# Patient Record
Sex: Female | Born: 1965 | Race: White | Hispanic: No | Marital: Single | Smoking: Former smoker
Health system: Southern US, Community
[De-identification: ages and names within clinical notes are randomized; demographics above are authoritative.]

## PROBLEM LIST (undated history)

## (undated) DIAGNOSIS — J449 Chronic obstructive pulmonary disease, unspecified: Secondary | ICD-10-CM

## (undated) DIAGNOSIS — E079 Disorder of thyroid, unspecified: Secondary | ICD-10-CM

## (undated) DIAGNOSIS — L0291 Cutaneous abscess, unspecified: Secondary | ICD-10-CM

## (undated) DIAGNOSIS — J40 Bronchitis, not specified as acute or chronic: Secondary | ICD-10-CM

## (undated) DIAGNOSIS — J45909 Unspecified asthma, uncomplicated: Secondary | ICD-10-CM

## (undated) HISTORY — PX: MOUTH SURGERY: SHX715

---

## 2012-01-23 ENCOUNTER — Emergency Department (HOSPITAL_COMMUNITY)
Admission: EM | Admit: 2012-01-23 | Discharge: 2012-01-23 | Disposition: A | Discharge status: Home / Self Care | Payer: Self-pay | Attending: Emergency Medicine | Admitting: Emergency Medicine

## 2012-01-23 ENCOUNTER — Encounter (HOSPITAL_COMMUNITY): Payer: Self-pay

## 2012-01-23 DIAGNOSIS — J4489 Other specified chronic obstructive pulmonary disease: Secondary | ICD-10-CM | POA: Insufficient documentation

## 2012-01-23 DIAGNOSIS — F17200 Nicotine dependence, unspecified, uncomplicated: Secondary | ICD-10-CM | POA: Insufficient documentation

## 2012-01-23 DIAGNOSIS — J449 Chronic obstructive pulmonary disease, unspecified: Secondary | ICD-10-CM | POA: Insufficient documentation

## 2012-01-23 DIAGNOSIS — E079 Disorder of thyroid, unspecified: Secondary | ICD-10-CM | POA: Insufficient documentation

## 2012-01-23 DIAGNOSIS — L0291 Cutaneous abscess, unspecified: Secondary | ICD-10-CM

## 2012-01-23 DIAGNOSIS — L988 Other specified disorders of the skin and subcutaneous tissue: Secondary | ICD-10-CM | POA: Insufficient documentation

## 2012-01-23 HISTORY — DX: Disorder of thyroid, unspecified: E07.9

## 2012-01-23 HISTORY — DX: Chronic obstructive pulmonary disease, unspecified: J44.9

## 2012-01-23 HISTORY — DX: Bronchitis, not specified as acute or chronic: J40

## 2012-01-23 HISTORY — DX: Cutaneous abscess, unspecified: L02.91

## 2012-01-23 MED ORDER — HYDROCODONE-ACETAMINOPHEN 5-325 MG PO TABS
1.0000 | ORAL_TABLET | Freq: Once | ORAL | Status: AC
  Administered 2012-01-23: 1 via ORAL
  Filled 2012-01-23: qty 1

## 2012-01-23 MED ORDER — SULFAMETHOXAZOLE-TMP DS 800-160 MG PO TABS
1.0000 | ORAL_TABLET | Freq: Once | ORAL | Status: AC
  Administered 2012-01-23: 1 via ORAL
  Filled 2012-01-23: qty 1

## 2012-01-23 MED ORDER — HYDROCODONE-ACETAMINOPHEN 5-325 MG PO TABS: ORAL_TABLET | ORAL | Status: AC

## 2012-01-23 MED ORDER — SULFAMETHOXAZOLE-TRIMETHOPRIM 800-160 MG PO TABS: 1.0000 | ORAL_TABLET | Freq: Two times a day (BID) | ORAL | Status: AC

## 2012-01-23 NOTE — ED Provider Notes (Signed)
History     CSN: [id number]  Arrival date & time 01/23/12  1924   First MD Initiated Contact with Patient 01/23/12 1943      Chief Complaint  Patient presents with  . Abscess    (Consider location/radiation/quality/duration/timing/severity/associated sxs/prior treatment) HPI Comments: Patient c/o red, painful lesion to the left forearm that she noticed several hours PTA.  She is unsure if it's a possible spider bite or abscess.  She denies fever, numbness of the arm or pain proximal or distal to the affected area.  Patient is a 46 y.o. female presenting with abscess. The history is provided by the patient.  Abscess  This is a new problem. The current episode started just prior to arrival. The onset was sudden. The problem occurs continuously. The problem has been unchanged. The abscess is present on the left arm. The problem is mild. The abscess is characterized by painfulness and redness. The patient was exposed to spider bites. The abscess first occurred at home. Pertinent negatives include no fever, no vomiting and no sore throat. Her past medical history is significant for skin abscesses in family. There were no sick contacts. She has received no recent medical care.    Past Medical History  Diagnosis Date  . Thyroid disease   . Bronchitis   . COPD (chronic obstructive pulmonary disease)   . Abscess     History reviewed. No pertinent past surgical history.  History reviewed. No pertinent family history.  History  Substance Use Topics  . Smoking status: Current Everyday Smoker -- 0.5 packs/day    Types: Cigarettes  . Smokeless tobacco: Not on file  . Alcohol Use: No    OB History    Grav Para Term Preterm Abortions TAB SAB Ect Mult Living                  Review of Systems  Constitutional: Negative for fever.  HENT: Negative for sore throat.   Gastrointestinal: Negative for vomiting.  Skin: Positive for color change and wound.  Neurological: Negative for  weakness and numbness.  Hematological: Does not bruise/bleed easily.  All other systems reviewed and are negative.    Allergies  Review of patient's allergies indicates no known allergies.  Home Medications   Current Outpatient Rx  Name Route Sig Dispense Refill  . LEVOTHYROXINE SODIUM 88 MCG PO TABS Oral Take 88 mcg by mouth daily.      BP 119/91  Pulse 89  Temp(Src) 98.1 F (36.7 C) (Oral)  Resp 20  Ht 5' (1.524 m)  Wt 187 lb (84.823 kg)  BMI 36.52 kg/m2  SpO2 98%  Physical Exam  Nursing note and vitals reviewed. Constitutional: She is oriented to person, place, and time. She appears well-developed and well-nourished. No distress.  HENT:  Head: Normocephalic and atraumatic.  Cardiovascular: Normal rate, regular rhythm, normal heart sounds and intact distal pulses.   Pulmonary/Chest: Effort normal and breath sounds normal.  Musculoskeletal: She exhibits tenderness. She exhibits no edema.       Left elbow: She exhibits normal range of motion, no swelling and no effusion. no tenderness found.       Arms: Neurological: She is alert and oriented to person, place, and time. She exhibits normal muscle tone. Coordination normal.  Skin:       See MS exam    ED Course  Procedures (including critical care time)  Labs Reviewed - No data to display      MDM    Previous  medical charts, nursing notes and vitals signs from this visit were reviewed by me   All laboratory results and/or imaging results performed on this visit, if applicable, were reviewed by me and discussed with the patient and/or parent as well as recommendation for follow-up    MEDICATIONS GIVEN IN ED:  Bactrim, norco   Two centimeter indurated papule to the lateral left forearm. Mild surrounding erythema. No drainage or fluctuance. Patient has been squeezing on the area. Does seem to be some bruising present. I have marked leading edge of the erythema. Patient agrees to water soaks and to return to  the ER in 1-2 days if the symptoms are worsening for possible incision and drainage.    PRESCRIPTIONS GIVEN AT DISCHARGE:  Bactrim , norco #20   Pt stable in ED with no significant deterioration in condition. Pt feels improved after observation and/or treatment in ED. Patient / Family / Caregiver understand and agree with initial ED impression and plan with expectations set for ED visit.  Patient agrees to return to ED for any worsening symptoms        Jase Reep L. [place], [place] 01/27/12 [id number]

## 2012-01-23 NOTE — Discharge Instructions (Signed)
Abscess An abscess (boil or furuncle) is an infected area under your skin. This area is filled with yellowish white fluid (pus). HOME CARE   Only take medicine as told by your doctor.   Keep the skin clean around your abscess. Keep clothes that may touch the abscess clean.   Change any bandages (dressings) as told by your doctor.   Avoid direct skin contact with other people. The infection can spread by skin contact with others.   Practice good hygiene and do not share personal care items.   Do not share athletic equipment, towels, or whirlpools. Shower after every practice or work out session.   If a draining area cannot be covered:   Do not play sports.   Children should not go to daycare until the wound has healed or until fluid (drainage) stops coming out of the wound.   See your doctor for a follow-up visit as told.  GET HELP RIGHT AWAY IF:   There is more pain, puffiness (swelling), and redness in the wound site.   There is fluid or bleeding from the wound site.   You have muscle aches, chills, fever, or feel sick.   You or your child has a temperature by mouth above 102 F (38.9 C), not controlled by medicine.   Your baby is older than 3 months with a rectal temperature of 102 F (38.9 C) or higher.  MAKE SURE YOU:   Understand these instructions.   Will watch your condition.   Will get help right away if you are not doing well or get worse.  Document Released: 01/27/2008 Document Revised: 07/30/2011 Document Reviewed: 01/27/2008 Exit[employer] Patient Information 2012 [employer]. 

## 2012-01-23 NOTE — ED Notes (Signed)
Place came up on left elbow early this evening, possible spider bite or may have a tick under my skin per pt. Have had an abscess before per pt.

## 2012-01-23 NOTE — ED Notes (Signed)
Pt presents with moderate size abscess on elbow with firm edema surrounding abscess site.  Abscess has a prominent dark center. Site is warm to touch. Pt denies fever.  NAD noted at this time.

## 2012-01-29 NOTE — ED Provider Notes (Signed)
Medical screening examination/treatment/procedure(s) were performed by non-physician practitioner and as supervising physician I was immediately available for consultation/collaboration.   Brennyn Haisley W. Oma Marzan, MD 01/29/12 2315 

## 2012-05-06 DIAGNOSIS — R079 Chest pain, unspecified: Secondary | ICD-10-CM

## 2017-04-20 ENCOUNTER — Encounter: Payer: Self-pay | Admitting: Internal Medicine

## 2017-04-20 ENCOUNTER — Ambulatory Visit (INDEPENDENT_AMBULATORY_CARE_PROVIDER_SITE_OTHER): Payer: Medicaid Other | Admitting: Internal Medicine

## 2017-04-20 ENCOUNTER — Ambulatory Visit (INDEPENDENT_AMBULATORY_CARE_PROVIDER_SITE_OTHER)
Admission: RE | Admit: 2017-04-20 | Discharge: 2017-04-20 | Disposition: A | Discharge status: Home / Self Care | Payer: Medicaid Other | Source: Ambulatory Visit | Attending: Internal Medicine | Admitting: Internal Medicine

## 2017-04-20 VITALS — BP 112/78 | HR 87 | Ht 60.0 in | Wt 207.4 lb

## 2017-04-20 DIAGNOSIS — J449 Chronic obstructive pulmonary disease, unspecified: Secondary | ICD-10-CM | POA: Diagnosis not present

## 2017-04-20 DIAGNOSIS — F17210 Nicotine dependence, cigarettes, uncomplicated: Secondary | ICD-10-CM | POA: Diagnosis not present

## 2017-04-20 MED ORDER — GLYCOPYRROLATE-FORMOTEROL 9-4.8 MCG/ACT IN AERO: 2.0000 | INHALATION_SPRAY | Freq: Two times a day (BID) | RESPIRATORY_TRACT | 11 refills | Status: DC

## 2017-04-20 MED ORDER — GLYCOPYRROLATE-FORMOTEROL 9-4.8 MCG/ACT IN AERO: 2.0000 | INHALATION_SPRAY | Freq: Two times a day (BID) | RESPIRATORY_TRACT | 0 refills | Status: DC

## 2017-04-20 MED ORDER — PREDNISONE 10 MG PO TABS: ORAL_TABLET | ORAL | 0 refills | Status: DC

## 2017-04-20 NOTE — Assessment & Plan Note (Addendum)
Spirometry 04/20/2017  FEV1 0.94 (39%)  Ratio 56 p am symb/duoneb w/in 2 h - 04/20/2017   Walked RA x one lap @ 185 stopped due to  Sob/ fast pace, no desat - 04/20/2017  After extensive coaching HFA effectiveness =    50%  > try bevespi 2bid   She has severe airflow obst and also MO causing significant limitation s tendency to exac so best characterized for now as Group B >  Lama/laba appropriate but just in case there is asthmatic component (seems unlikely based in pattern and the fact she hasn't flared despite very poor hfa to date) > rec  Pred x 12 day taper and if better on pred and worse off then change to trelegy if insurance will allow it    Total time devoted to counseling  > 50 % of initial 60 min office visit:  review case with pt/ discussion of options/alternatives/ personally creating written customized instructions  in presence of pt  then going over those specific  Instructions directly with the pt including how to use all of the meds but in particular covering each new medication in detail and the difference between the maintenance= "automatic" meds and the prns using an action plan format for the latter (If this problem/symptom => do that organization reading Left to right).  Please see AVS from this visit for a full list of these instructions which I personally wrote for this pt and  are unique to this visit.

## 2017-04-20 NOTE — Assessment & Plan Note (Signed)
>   3 min Discussed the risks and costs (both direct and indirect)  of smoking relative to the benefits of quitting but patient unwilling to commit at this point to a specific quit date.    Although I don't endorse regular use of e cigs/ many pts find them helpful; however, I emphasized they should be considered a "one-way bridge" off all tobacco products.  

## 2017-04-20 NOTE — Assessment & Plan Note (Signed)
Body mass index is 40.51 kg/m.  -  trending up No results found for: TSH   Contributing to gerd risk/ doe/reviewed the need and the process to achieve and maintain neg calorie balance > defer f/u primary care including intermittently monitoring thyroid status

## 2017-04-20 NOTE — Progress Notes (Signed)
Spoke with pt and notified of results per Dr. Wert. Pt verbalized understanding and denied any questions. 

## 2017-04-20 NOTE — Patient Instructions (Addendum)
Plan A = Automatic = Bevespi Take 2 puffs first thing in am and then another 2 puffs about 12 hours later.     Work on inhaler technique:  relax and gently blow all the way out then take a nice smooth deep breath back in, triggering the inhaler at same time you start breathing in.  Hold for up to 5 seconds if you can. Blow out thru nose. Rinse and gargle with water when done      Plan B = Backup Only use your albuterol (proair) as a rescue medication to be used if you can't catch your breath by resting or doing a relaxed purse lip breathing pattern.  - The less you use it, the better it will work when you need it. - Ok to use the inhaler up to 2 puffs  every 4 hours if you must but call for appointment if use goes up over your usual need - Don't leave home without it !!  (think of it like the spare tire for your car)   Plan C = Crisis - only use your albuterol nebulizer if you first try Plan B and it fails to help > ok to use the nebulizer up to every 4 hours but if start needing it regularly call for immediate appointment   Take Prednisone 10 mg x 4 for three days 3 for three days 2 for three days 1 for three days and stop    Please schedule a follow up office visit in 6 weeks, call sooner if needed with pfts on return

## 2017-04-20 NOTE — Progress Notes (Signed)
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 Pulmonary Consult    Referred by Dr. Colvin Caroli. Pt states that she was dxed with COPD in 2009. She states that she gets winded walking short distances such as from room to room at home. She also c/o cough- worse at night and first thing in the am- clear sputum.    uses neb first thing in am then symbicort w/in half an hour which was about 2 h prior to OV   Last pred was nov 2017  Wakes up congested/ wheezy but no purulent sputum Doe= MMRC3 = can't walk 100 yards even at a slow pace at a flat grade s stopping due to sob  Onset was indolent and pattern is progressive and parallels wt gain also Worse breathing when exp to strong odors/ heat and humidity   No obvious other patterns in day to day or daytime variability or assoc excess/ purulent sputum or mucus plugs or hemoptysis or cp or chest tightness, subjective wheeze or overt sinus or hb symptoms. No unusual exp hx or h/o childhood pna/ asthma or knowledge of premature birth.  Sleeping ok without nocturnal  or early am exacerbation  of respiratory  c/o's or need for noct saba. Also denies any obvious fluctuation of symptoms with weather or environmental changes or other aggravating or alleviating factors except as outlined above   Current Medications, Allergies, Complete Past Medical History, Past Surgical History, Family History, and Social History were reviewed in [employer] record.  ROS   The following are not active complaints unless bolded sore throat, dysphagia, dental problems, itching, sneezing,  nasal congestion or excess/ purulent secretions, ear ache,   fever, chills, sweats, unintended wt loss, classically pleuritic or exertional cp,  orthopnea pnd or leg swelling, presyncope, palpitations, abdominal pain, anorexia, nausea, vomiting, diarrhea  or change in bowel or bladder habits, change in stools or urine, dysuria,hematuria,  rash, arthralgias, visual complaints, headache, numbness, weakness or ataxia or problems with walking or coordination,  change in mood/affect or memory.            Review of Systems     Objective:   Physical Exam    amb obese hoarse wf nad  Wt Readings from Last 3 Encounters:  04/20/17 207 lb 6.4 oz (94.1 kg)  01/23/12 187 lb (84.8 kg)    Vital signs reviewed - Note on arrival 02 sats  96% on RA      HEENT: nl dentition, turbinates bilaterally, and oropharynx. Nl external ear canals without cough reflex   NECK :  without JVD/Nodes/TM/ nl carotid upstrokes bilaterally   LUNGS: no acc muscle use,  slt barrel chest with pan exp distant bilateral  wheezes  CV:  RRR  no s3 or murmur or increase in P2, and no edema   ABD:  Quite obese soft and nontender with  Pos early hoover's in the supine position. No bruits or  organomegaly appreciated, bowel sounds nl  MS:  Nl gait/ ext warm without deformities, calf tenderness, cyanosis or clubbing No obvious joint restrictions   SKIN: warm and dry without lesions    NEURO:  alert, approp, nl sensorium with  no motor or cerebellar deficits apparent.    CXR PA and Lateral:   04/20/2017 :    I personally reviewed images and  impression as follows:  Moderate copd/cb changes      Assessment:

## 2017-06-02 ENCOUNTER — Other Ambulatory Visit (INDEPENDENT_AMBULATORY_CARE_PROVIDER_SITE_OTHER): Payer: Medicaid Other

## 2017-06-02 ENCOUNTER — Ambulatory Visit (INDEPENDENT_AMBULATORY_CARE_PROVIDER_SITE_OTHER): Payer: Medicaid Other | Admitting: Internal Medicine

## 2017-06-02 ENCOUNTER — Encounter: Payer: Self-pay | Admitting: Internal Medicine

## 2017-06-02 VITALS — BP 130/72 | HR 86 | Ht 60.0 in | Wt 210.0 lb

## 2017-06-02 DIAGNOSIS — F17210 Nicotine dependence, cigarettes, uncomplicated: Secondary | ICD-10-CM

## 2017-06-02 DIAGNOSIS — J449 Chronic obstructive pulmonary disease, unspecified: Secondary | ICD-10-CM

## 2017-06-02 LAB — PULMONARY FUNCTION TEST
DL/VA % PRED: 86 %
DL/VA: 3.68 ml/min/mmHg/L
DLCO UNC % PRED: 66 %
DLCO cor % pred: 66 %
DLCO cor: 12.49 ml/min/mmHg
DLCO unc: 12.64 ml/min/mmHg
FEF 25-75 PRE: 0.34 L/s
FEF 25-75 Post: 0.3 L/sec
FEF2575-%CHANGE-POST: -12 %
FEF2575-%Pred-Post: 12 %
FEF2575-%Pred-Pre: 13 %
FEV1-%Change-Post: 0 %
FEV1-%Pred-Post: 33 %
FEV1-%Pred-Pre: 33 %
FEV1-POST: 0.79 L
FEV1-PRE: 0.79 L
FEV1FVC-%CHANGE-POST: 9 %
FEV1FVC-%Pred-Pre: 51 %
FEV6-%CHANGE-POST: -8 %
FEV6-%PRED-POST: 56 %
FEV6-%PRED-PRE: 61 %
FEV6-PRE: 1.81 L
FEV6-Post: 1.66 L
FEV6FVC-%CHANGE-POST: 0 %
FEV6FVC-%PRED-PRE: 98 %
FEV6FVC-%Pred-Post: 98 %
FVC-%Change-Post: -8 %
FVC-%Pred-Post: 57 %
FVC-%Pred-Pre: 63 %
FVC-Post: 1.74 L
FVC-Pre: 1.9 L
POST FEV6/FVC RATIO: 95 %
PRE FEV1/FVC RATIO: 41 %
Post FEV1/FVC ratio: 45 %
Pre FEV6/FVC Ratio: 95 %
RV % PRED: 210 %
RV: 3.42 L
TLC % PRED: 122 %
TLC: 5.45 L

## 2017-06-02 LAB — BASIC METABOLIC PANEL
BUN: 10 mg/dL (ref 6–23)
CHLORIDE: 103 meq/L (ref 96–112)
CO2: 28 meq/L (ref 19–32)
CREATININE: 0.74 mg/dL (ref 0.40–1.20)
Calcium: 9.3 mg/dL (ref 8.4–10.5)
GFR: 87.53 mL/min (ref >60.00)
Glucose, Bld: 92 mg/dL (ref 70–99)
POTASSIUM: 4 meq/L (ref 3.5–5.1)
SODIUM: 139 meq/L (ref 135–145)

## 2017-06-02 LAB — CBC WITH DIFFERENTIAL/PLATELET
BASOS PCT: 0.8 % (ref 0.0–3.0)
Basophils Absolute: 0.1 10*3/uL (ref 0.0–0.1)
EOS PCT: 2 % (ref 0.0–5.0)
Eosinophils Absolute: 0.1 10*3/uL (ref 0.0–0.7)
HEMATOCRIT: 45.2 % (ref 36.0–46.0)
HEMOGLOBIN: 14.8 g/dL (ref 12.0–15.0)
LYMPHS PCT: 25.8 % (ref 12.0–46.0)
Lymphs Abs: 1.9 10*3/uL (ref 0.7–4.0)
MCHC: 32.8 g/dL (ref 30.0–36.0)
MCV: 92.9 fl (ref 78.0–100.0)
Monocytes Absolute: 0.4 10*3/uL (ref 0.1–1.0)
Monocytes Relative: 5.9 % (ref 3.0–12.0)
NEUTROS ABS: 4.9 10*3/uL (ref 1.4–7.7)
Neutrophils Relative %: 65.5 % (ref 43.0–77.0)
PLATELETS: 233 10*3/uL (ref 150.0–400.0)
RBC: 4.87 Mil/uL (ref 3.87–5.11)
RDW: 13.6 % (ref 11.5–15.5)
WBC: 7.5 10*3/uL (ref 4.0–10.5)

## 2017-06-02 MED ORDER — FAMOTIDINE 20 MG PO TABS: ORAL_TABLET | ORAL | 11 refills | Status: DC

## 2017-06-02 MED ORDER — FLUTTER DEVI: 1.0000 | 0 refills | Status: AC | PRN

## 2017-06-02 MED ORDER — PANTOPRAZOLE SODIUM 40 MG PO TBEC: 40.0000 mg | DELAYED_RELEASE_TABLET | Freq: Every day | ORAL | 11 refills | Status: DC

## 2017-06-02 MED ORDER — TIOTROPIUM BROMIDE MONOHYDRATE 2.5 MCG/ACT IN AERS: 2.0000 | INHALATION_SPRAY | Freq: Every day | RESPIRATORY_TRACT | 11 refills | Status: DC

## 2017-06-02 MED ORDER — TIOTROPIUM BROMIDE MONOHYDRATE 2.5 MCG/ACT IN AERS: 2.0000 | INHALATION_SPRAY | Freq: Every day | RESPIRATORY_TRACT | 0 refills | Status: DC

## 2017-06-02 MED ORDER — BUDESONIDE-FORMOTEROL FUMARATE 160-4.5 MCG/ACT IN AERO: INHALATION_SPRAY | RESPIRATORY_TRACT | 12 refills | Status: DC

## 2017-06-02 NOTE — Progress Notes (Signed)
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 Pulmonary Consult    Referred by Dr. Colvin Caroli. Pt states that she was dxed with COPD in 2009. She states that she gets winded walking short distances such as from room to room at home. She also c/o cough- worse at night and first thing in the am- clear sputum.    uses neb first thing in am then symbicort w/in half an hour which was about 2 h prior to OV   Last pred was nov 2017 ? [facility]  Wakes up congested/ wheezy but no purulent sputum Doe= MMRC3 = can't walk 100 yards even at a slow pace at a flat grade s stopping due to sob  Onset was indolent and pattern is progressive and parallels wt gain also Worse breathing when exp to strong odors/ heat and humidity  rec Plan A = Automatic = Bevespi Take 2 puffs first thing in am and then another 2 puffs about 12 hours later.  Work on inhaler technique:  Plan B = Backup Only use your albuterol (proair) Plan C = Crisis - only use your albuterol nebulizer if you first try Plan B and it fails to help > ok to use the nebulizer up to every 4 hours but if start needing it regularly call for immediate appointment Take Prednisone 10 mg x 4 for three days 3 for three days 2 for three days 1 for three days and stop    06/02/2017  f/u ov/Wert re:  GOLD III/ still smoking  Chief Complaint  Patient presents with  . Follow-up    SOB, chest tightness after taking prednison   doe =  MMRC3 = can't walk 100 yards even at a slow pace at a flat grade s stopping due to sob Sleeping on L side down / no 02    Minimal am mucus but worse coughing fits daytime since last ov and no better p pred or changing back to symb 160    No obvious day to day or daytime variability or assoc excess/ purulent sputum or mucus plugs or hemoptysis or  Ongoing  cp or chest tightness(tansient on pred) , subjective wheeze or overt sinus or hb symptoms. No unusual exp hx or h/o childhood pna/ asthma or knowledge of premature birth.  Sleeping ok flat without nocturnal  or early am exacerbation  of respiratory  c/o's or need for noct saba. Also denies any obvious fluctuation of symptoms with weather or environmental changes or other aggravating or alleviating factors except as outlined above   Current Allergies, Complete Past Medical History, Past Surgical History, Family History, and Social History were reviewed in [employer] record.  ROS  The following are not active complaints unless bolded Hoarseness, sore throat, dysphagia, dental problems, itching, sneezing,  nasal congestion or discharge of excess mucus or purulent secretions, ear ache,   fever, chills,  sweats, unintended wt loss or wt gain, classically pleuritic or exertional cp,  orthopnea pnd or leg swelling, presyncope, palpitations, abdominal pain, anorexia, nausea, vomiting, diarrhea  or change in bowel habits or change in bladder habits, change in stools or change in urine, dysuria, hematuria,  rash, arthralgias, visual complaints, headache, numbness, weakness or ataxia or problems with walking or coordination,  change in mood/affect or memory.        Current Meds  Medication Sig   symbicort  160 2bid  Inhale 2 puffs into the lungs 2 (two) times daily.  Marland Kitchen ipratropium-albuterol (DUONEB) 0.5-2.5 (3) MG/3ML SOLN 1 vial in neb every 6 hours as needed  . levothyroxine (SYNTHROID, LEVOTHROID) 75 MCG tablet Take 75 mcg by mouth  daily.  Marland Kitchen PROAIR HFA 108 (90 Base) MCG/ACT inhaler Inhale 2 puffs into the lungs every 4 (four) hours as needed.  . simvastatin (ZOCOR) 20 MG tablet Take 20 mg by mouth at bedtime.  . [DISCONTINUED] predniSONE (DELTASONE) 10 MG tablet Take 4 for three days 3 for three days 2 for three days 1 for three days and stop                 Objective:   Physical Exam    amb obese wf nad    06/02/2017    210   04/20/17 207 lb 6.4 oz (94.1 kg)  01/23/12 187 lb (84.8 kg)    Vital signs reviewed - Note on arrival 02 sats  95% on RA      HEENT: nl dentition, turbinates bilaterally, and oropharynx. Nl external ear canals without cough reflex   NECK :  without JVD/Nodes/TM/ nl carotid upstrokes bilaterally   LUNGS: no acc muscle use,  slt barrel chest with severe coughing fits/ distant bs with mostly pseudowheeze better with plm   CV:  RRR  no s3 or murmur or increase in P2, and no edema   ABD:  Quite obese soft and nontender with  Pos early hoover's in the supine position. No bruits or organomegaly appreciated, bowel sounds nl  MS:  Nl gait/ ext warm without deformities, calf tenderness, cyanosis or clubbing No obvious joint restrictions   SKIN: warm and dry without lesions    NEURO:  alert, approp, nl sensorium with  no motor or cerebellar deficits apparent.     I personally reviewed images and agree with radiology impression as follows:  CXR:    04/20/17 COPD.  No active disease     Assessment:

## 2017-06-02 NOTE — Progress Notes (Signed)
PFT done today. 

## 2017-06-02 NOTE — Assessment & Plan Note (Signed)
Spirometry 04/20/2017  FEV1 0.94 (39%)  Ratio 56 p am symb/duoneb w/in 2 h - 04/20/2017   Walked RA x one lap @ 185 stopped due to  Sob/ fast pace, no desat - 04/20/2017  After extensive coaching HFA effectiveness =    50%  > try bevespi 2bid  No better   - PFT's  06/02/2017  FEV1 0.79  (33 % ) ratio 45  p no % improvement from saba p symbicort x 2 pffs  prior to study with DLCO  66/66c % corrects to 86  % for alv volume   - added flutter valve 06/02/2017 - 06/02/2017  After extensive coaching HFA effectiveness =    75% > try spiriva smi 2 qam and gerd rx    DDX of  difficult airways management almost all start with A and  include Adherence, Ace Inhibitors, Acid Reflux, Active Sinus Disease, Alpha 1 Antitripsin deficiency, Anxiety masquerading as Airways dz,  ABPA,  Allergy(esp in young), Aspiration (esp in elderly), Adverse effects of meds,  Active smokers, A bunch of PE's (a small clot burden can't cause this syndrome unless there is already severe underlying pulm or vascular dz with poor reserve) plus two Bs  = Bronchiectasis and Beta blocker use..and one C= CHF  Adherence is always the initial "prime suspect" and is a multilayered concern that requires a "trust but verify" approach in every patient - starting with knowing how to use medications, especially inhalers, correctly, keeping up with refills and understanding the fundamental difference between maintenance and prns vs those medications only taken for a very short course and then stopped and not refilled.  - see hfa teaching - return with all meds in hand using a trust but verify approach to confirm accurate Medication  Reconciliation The principal here is that until we are certain that the  patients are doing what we've asked, it makes no sense to ask them to do more.   Active smoking (see separate a/p)   ? Acid (or non-acid) GERD > always difficult to exclude as up to 75% of pts in some series report no assoc GI/ Heartburn symptoms and she  reports worsening symptoms with prednisone which is more c/w gerd than ab> rec max (24h)  acid suppression and diet restrictions/ reviewed and instructions given in writing.  ? Alpha one AT def > check phenotype/ levels    I had an extended discussion with the patient reviewing all relevant studies completed to date and  lasting 15 to 20 minutes of a 25 minute visit    Each maintenance medication was reviewed in detail including most importantly the difference between maintenance and prns and under what circumstances the prns are to be triggered using an action plan format that is not reflected in the computer generated alphabetically organized AVS.    Please see AVS for specific instructions unique to this visit that I personally wrote and verbalized to the the pt in detail and then reviewed with pt  by my nurse highlighting any  changes in therapy recommended at today's visit to their plan of care.

## 2017-06-02 NOTE — Assessment & Plan Note (Signed)
Body mass index is 41.01 kg/m.  -  trending up No results found for: TSH   Contributing to gerd risk/ doe/reviewed the need and the process to achieve and maintain neg calorie balance > defer f/u primary care including intermittently monitoring thyroid status

## 2017-06-02 NOTE — Assessment & Plan Note (Signed)
>   3 min   Obstacles are lives with smoker  Life or breath reviewed > Follow up per Primary Care planned

## 2017-06-02 NOTE — Patient Instructions (Addendum)
Plan A = Automatic = symbicort 160 x 2pffs then spiriva x 2 pffs first thing in am and repeat symb x 2 puffs in  am  Pantoprazole (protonix) 40 mg   Take  30-60 min before first meal of the day and Pepcid (famotidine)  20 mg one @  bedtime until return to office - this is the best way to tell whether stomach acid is contributing to your problem.    GERD (REFLUX)  is an extremely common cause of respiratory symptoms just like yours , many times with no obvious heartburn at all.    It can be treated with medication, but also with lifestyle changes including elevation of the head of your bed (ideally with 6 inch  bed blocks),  Smoking cessation, avoidance of late meals, excessive alcohol, and avoid fatty foods, chocolate, peppermint, colas, red wine, and acidic juices such as orange juice.  NO MINT OR MENTHOL PRODUCTS SO NO COUGH DROPS   USE SUGARLESS CANDY INSTEAD (Jolley ranchers or Stover's or Life Savers) or even ice chips will also do - the key is to swallow to prevent all throat clearing. NO OIL BASED VITAMINS - use powdered substitutes.     Plan B = Backup Only use your albuterol as a rescue medication to be used if you can't catch your breath by resting or doing a relaxed purse lip breathing pattern.  - The less you use it, the better it will work when you need it. - Ok to use the inhaler up to 2 puffs  every 4 hours if you must but call for appointment if use goes up over your usual need - Don't leave home without it !!  (think of it like the spare tire for your car)   Plan C = Crisis - only use your albuterol nebulizer if you first try Plan B and it fails to help > ok to use the nebulizer up to every 4 hours but if start needing it regularly call for immediate appointment   The key is to stop smoking completely before smoking completely stops you - it's a matter of life and breath   For cough > mucinex dm up to 1200 mg every 12 hours and cough into the flutter valve    Please  remember to go to the lab department downstairs in the basement  for your tests - we will call you with the results when they are available.       Please schedule a follow up office visit in 6 weeks, call sooner if needed

## 2017-06-07 LAB — ALPHA-1 ANTITRYPSIN PHENOTYPE: A1 ANTITRYPSIN SER: 158 mg/dL (ref 83–199)

## 2017-06-07 LAB — ALPHA-1-ANTITRYPSIN: A-1 Antitrypsin, Ser: 167 mg/dL (ref 83–199)

## 2017-06-07 NOTE — Progress Notes (Signed)
LMTCB

## 2017-06-08 ENCOUNTER — Telehealth: Payer: Self-pay | Admitting: Internal Medicine

## 2017-06-08 NOTE — Telephone Encounter (Signed)
Notes recorded by Nyoka Cowden, MD on 06/06/2017 at 5:06 AM EDT Call patient : Studies are unremarkable, no change in recs  Spoke with patient. She is aware of results. Nothing else needed at time of call.

## 2017-07-14 ENCOUNTER — Ambulatory Visit: Payer: Medicaid Other | Admitting: Internal Medicine

## 2017-07-19 ENCOUNTER — Ambulatory Visit: Payer: Medicaid Other | Admitting: Internal Medicine

## 2017-07-19 ENCOUNTER — Encounter: Payer: Self-pay | Admitting: Internal Medicine

## 2017-07-19 VITALS — BP 116/62 | HR 90 | Ht 59.0 in | Wt 212.8 lb

## 2017-07-19 DIAGNOSIS — B370 Candidal stomatitis: Secondary | ICD-10-CM | POA: Diagnosis not present

## 2017-07-19 DIAGNOSIS — F17210 Nicotine dependence, cigarettes, uncomplicated: Secondary | ICD-10-CM | POA: Diagnosis not present

## 2017-07-19 DIAGNOSIS — J449 Chronic obstructive pulmonary disease, unspecified: Secondary | ICD-10-CM

## 2017-07-19 MED ORDER — GLYCOPYRROLATE-FORMOTEROL 9-4.8 MCG/ACT IN AERO: 2.0000 | INHALATION_SPRAY | Freq: Two times a day (BID) | RESPIRATORY_TRACT | 0 refills | Status: DC

## 2017-07-19 MED ORDER — CLOTRIMAZOLE 10 MG MT TROC: 10.0000 mg | Freq: Four times a day (QID) | OROMUCOSAL | 2 refills | Status: DC | PRN

## 2017-07-19 MED ORDER — FAMOTIDINE 20 MG PO TABS: ORAL_TABLET | ORAL | 11 refills | Status: DC

## 2017-07-19 NOTE — Assessment & Plan Note (Signed)
Trial of clotrimazole and off ICS  07/19/2017 >>>  ent f/u prn

## 2017-07-19 NOTE — Progress Notes (Signed)
Subjective:     Patient ID: Debra Page, female   DOB: 06/08/1966,     MRN: [id number]    Brief patient profile:  51 yowf active smoker/ [facility]  With healthy childhood but with onset around 2008 @ wt around  150  doe and had to stop doing housekeeping and on disability since 2013 for copd and referred to pulmonary clinic 04/20/2017 by Dr   Colvin CaroliKaren House on dulera/symb and nebulizer with GOLD III copd criteria at initial eval    History of Present Illness  04/20/2017 1st [facility] office visit/ Mazi Schuff  Symb/duoneb/proair Chief Complaint  Patient presents with  . Pulmonary Consult    Referred by Dr. Colvin CaroliKaren House. Pt states that she was dxed with COPD in 2009. She states that she gets winded walking short distances such as from room to room at home. She also c/o cough- worse at night and first thing in the am- clear sputum.    uses neb first thing in am then symbicort w/in half an hour which was about 2 h prior to OV   Last pred was nov 2017 ? Benef[facility]  Wakes up congested/ wheezy but no purulent sputum Doe= MMRC3 = can't walk 100 yards even at a slow pace at a flat grade s stopping due to sob  Onset was indolent and pattern is progressive and parallels wt gain also Worse breathing when exp to strong odors/ heat and humidity  rec Plan A = Automatic = Bevespi Take 2 puffs first thing in am and then another 2 puffs about 12 hours later.  Work on inhaler technique:  Plan B = Backup Only use your albuterol (proair) Plan C = Crisis - only use your albuterol nebulizer if you first try Plan B and it fails to help > ok to use the nebulizer up to every 4 hours but if start needing it regularly call for immediate appointment Take Prednisone 10 mg x 4 for three days 3 for three days 2 for three days 1 for three days and stop    06/02/2017  f/u ov/Jarel Cuadra re:  GOLD III/ still smoking  Chief Complaint  Patient presents with  . Follow-up    SOB, chest tightness after taking prednison  doe  = MMRC3 = can't walk 100 yards even at a slow pace at a flat grade s stopping due to sob Sleeping on L side down / no 02    Minimal am mucus but worse coughing fits daytime since last ov and no better p pred or changing back to symb 160  rec Plan A = Automatic = symbicort 160 x 2pffs then spiriva x 2 pffs first thing in am and repeat symb x 2 puffs in  am  Pantoprazole (protonix) 40 mg   Take  30-60 min before first meal of the day and Pepcid (famotidine)  20 mg one @  bedtime until return to office - this is the best way to tell whether stomach acid is contributing to your problem.   GERD diet  Plan B = Backup Only use your albuterol as a rescue medication Plan C = Crisis - only use your albuterol nebulizer if you first try Plan B and it fails to help > ok to use the nebulizer up to every 4 hours but if start needing it regularly call for immediate appointment The key is to stop smoking completely before smoking completely stops you - it's a matter of life and breath For cough >  mucinex dm up to 1200 mg every 12 hours and cough into the flutter valve      07/19/2017  Extended f/u ov/Cartrell Bentsen re: GOLD  III/ still smoking / symb 160 no better on spiriva so stopped  Chief Complaint  Patient presents with  . Follow-up    went to health dept. last month and was told she had thrush and was placed on swish and swallow, it is not better, sinus pressure, not sleeping, sore throat, congestion, dry cough, sides and lower back sore from coughing  cough is some better with mucinex dm and flutter valve  For the last month has developed dysphagia/ ear pressure both sides equal > health dept > dx thrush/ pcn /nystatisn > no better at all / not using pepcid at hs over same period of time  Not needing an saba either hfa or neb Easily confused with details of care/ poor hfa   No obvious day to day or daytime variability or assoc excess/ purulent sputum or mucus plugs or hemoptysis or cp or chest tightness,  subjective wheeze or overt sinus or hb symptoms. No unusual exp hx or h/o childhood pna/ asthma or knowledge of premature birth.  Sleeping ok on L side (can't lie supine)  without nocturnal  or early am exacerbation  of respiratory  c/o's or need for noct saba. Also denies any obvious fluctuation of symptoms with weather or environmental changes or other aggravating or alleviating factors except as outlined above   Current Allergies, Complete Past Medical History, Past Surgical History, Family History, and Social History were reviewed in [facility] electronic [facility] record.  ROS  The following are not active complaints unless bolded Hoarseness, sore throat, dysphagia, dental problems, itching, sneezing,  nasal congestion or discharge of excess mucus or purulent secretions, ear aches,   fever, chills, sweats, unintended wt loss or wt gain, classically pleuritic or exertional cp,  orthopnea pnd or leg swelling, presyncope, palpitations, abdominal pain, anorexia, nausea, vomiting, diarrhea  or change in bowel habits or change in bladder habits, change in stools or change in urine, dysuria, hematuria,  rash, arthralgias, visual complaints, headache, numbness, weakness or ataxia or problems with walking or coordination,  change in mood/affect or memory.        Current Meds  Medication Sig  . ipratropium-albuterol (DUONEB) 0.5-2.5 (3) MG/3ML SOLN 1 vial in neb every 6 hours as needed  . levothyroxine (SYNTHROID, LEVOTHROID) 75 MCG tablet Take 75 mcg by mouth daily.  . pantoprazole (PROTONIX) 40 MG tablet Take 1 tablet (40 mg total) by mouth daily. Take 30-60 min before first meal of the day  . PROAIR HFA 108 (90 Base) MCG/ACT inhaler Inhale 2 puffs into the lungs every 4 (four) hours as needed.  Marland Kitchen. Respiratory Therapy Supplies (FLUTTER) DEVI 1 Device by Does not apply route as needed.  . simvastatin (ZOCOR) 20 MG tablet Take 20 mg by mouth at bedtime.  . [  budesonide-formoterol (SYMBICORT) 160-4.5  MCG/ACT inhaler Take 2 puffs first thing in am and then another 2 puffs about 12 hours later.             Objective:   Physical Exam    Hoarse amb obese wf / very somber  07/19/2017     212  06/02/2017    210   04/20/17 207 lb 6.4 oz (94.1 kg)  01/23/12 187 lb (84.8 kg)    Vital signs reviewed- - - Note on arrival 02 sats  95% on RA  HEENT: nl dentition, turbinates bilaterally,   Nl external ear canals without cough reflex - severe thrush    NECK :  without JVD/Nodes/TM/ nl carotid upstrokes bilaterally   LUNGS: no acc muscle use,  Nl contour chest with distant bs but not wheezes or rhonchi    CV:  RRR  no s3 or murmur or increase in P2, and no edema   ABD:  soft and nontender with nl inspiratory excursion in the supine position. No bruits or organomegaly appreciated, bowel sounds nl  MS:  Nl gait/ ext warm without deformities, calf tenderness, cyanosis or clubbing No obvious joint restrictions   SKIN: warm and dry without lesions    NEURO:  alert, approp, nl sensorium with  no motor or cerebellar deficits apparent.        Assessment:

## 2017-07-19 NOTE — Assessment & Plan Note (Signed)
Spirometry 04/20/2017  FEV1 0.94 (39%)  Ratio 56 p am symb/duoneb w/in 2 h - 04/20/2017   Walked RA x one lap @ 185 stopped due to  Sob/ fast pace, no desat - 04/20/2017  After extensive coaching HFA effectiveness =    50%  > try bevespi 2bid  No better  - PFT's  06/02/2017  FEV1 0.79  (33 % ) ratio 45  p no % improvement from saba p symbicort x 2 pffs  prior to study with DLCO  66/66c % corrects to 86  % for alv volume   - added flutter valve 06/02/2017\ - Alpha One Screening 06/02/17  MM  Level 158 - 06/02/2017    try spiriva smi 2 qam and gerd rx > no better / thrush on symbiocrt 160 @ ov 07/19/2017  - 07/19/2017  After extensive coaching HFA effectiveness =   75% > change back to bevespi 2 bid     Symptoms remain difficult to control chronically. DDX of  difficult airways management almost all start with A and  include Adherence, Ace Inhibitors, Acid Reflux, Active Sinus Disease, Alpha 1 Antitripsin deficiency, Anxiety masquerading as Airways dz,  ABPA,  Allergy(esp in young), Aspiration (esp in elderly), Adverse effects of meds,  Active smokers, A bunch of PE's (a small clot burden can't cause this syndrome unless there is already severe underlying pulm or vascular dz with poor reserve) plus two Bs  = Bronchiectasis and Beta blocker use..and one C= CHF   Adherence is always the initial "prime suspect" and is a multilayered concern that requires a "trust but verify" approach in every patient - starting with knowing how to use medications, especially inhalers, correctly, keeping up with refills and understanding the fundamental difference between maintenance and prns vs those medications only taken for a very short course and then stopped and not refilled.  - see hfa teaching - return with all meds in hand using a trust but verify approach to confirm accurate Medication  Reconciliation The principal here is that until we are certain that the  patients are doing what we've asked, it makes no sense to  ask them to do more.   Active smoking at top of usual list of suspects (see separate a/p)   ? Acid (or non-acid) GERD > always difficult to exclude as up to 75% of pts in some series report no assoc GI/ Heartburn symptoms> rec max (24h)  acid suppression and diet restrictions/ reviewed and instructions given in writing.   ? Allergy/ asthma > strongly doubt so should be fine off ICS which needs to happen anyway due to thrush (see separate a/p)   ? Anxiety > usually at the bottom of this list of usual suspects but should be much higher on this pt's based on H and P and may be playing a role in stopping smoking/adering to med rx    ? Active sinus Dz > ent eval for this and sore throat if not better p above rx implemented   I had an extended discussion with the patient reviewing all relevant studies completed to date and  lasting 25 minutes of a 40  minute extended  visit  re  severe non-specific but potentially very serious refractory respiratory symptoms of uncertain and potentially multiple  etiologies.   Each maintenance medication was reviewed in detail including most importantly the difference between maintenance and prns and under what circumstances the prns are to be triggered using an action plan format that is  not reflected in the computer generated alphabetically organized AVS.    Please see AVS for specific instructions unique to this office visit that I personally wrote and verbalized to the the pt in detail and then reviewed with pt  by my nurse highlighting any changes in therapy/plan of care  recommended at today's visit.

## 2017-07-19 NOTE — Assessment & Plan Note (Signed)

## 2017-07-19 NOTE — Assessment & Plan Note (Signed)
Body mass index is 42.98 kg/m.  -  trending up still  No results found for: TSH   Contributing to gerd risk/ doe/reviewed the need and the process to achieve and maintain neg calorie balance > defer f/u primary care including intermittently monitoring thyroid status

## 2017-07-19 NOTE — Patient Instructions (Addendum)
Plan A = Automatic = change to BEVESPI  Take 2 puffs first thing in am and then another 2 puffs about 12 hours later.   Pantoprazole (protonix) 40 mg   Take  30-60 min before first meal of the day and Pepcid (famotidine)  20 mg one @  bedtime until return to office - this is the best way to tell whether stomach acid is contributing to your problem.    Plan B = Backup Only use your albuterol (proair)  as a rescue medication to be used if you can't catch your breath by resting or doing a relaxed purse lip breathing pattern.  - The less you use it, the better it will work when you need it. - Ok to use the inhaler up to 2 puffs  every 4 hours if you must but call for appointment if use goes up over your usual need - Don't leave home without it !!  (think of it like the spare tire for your car)   Plan C = Crisis - only use your albuterol nebulizer if you first try Plan B and it fails to help > ok to use the nebulizer up to every 4 hours but if start needing it regularly call for immediate appointment   Clotrimazole troche 10 four times a day as needed for thrush    Please schedule a follow up office visit in 2 weeks, sooner if needed  To See Tammy NP with all medications /inhalers/ solutions in hand so we can verify exactly what you are taking. This includes all medications from all doctors and over the counters

## 2017-07-30 ENCOUNTER — Telehealth: Payer: Self-pay | Admitting: Internal Medicine

## 2017-07-30 NOTE — Telephone Encounter (Signed)
Lm with her mother to have the pt call us back.

## 2017-08-02 ENCOUNTER — Ambulatory Visit: Payer: Medicaid Other | Admitting: Internal Medicine

## 2017-08-04 NOTE — Telephone Encounter (Signed)
Attempted to contact pt. There was no answer and I could not leave a message. Will try back.

## 2017-08-05 ENCOUNTER — Ambulatory Visit: Payer: Medicaid Other | Admitting: Internal Medicine

## 2017-08-05 ENCOUNTER — Other Ambulatory Visit: Payer: Self-pay | Admitting: Internal Medicine

## 2017-08-05 MED ORDER — PREDNISONE 10 MG PO TABS: ORAL_TABLET | ORAL | 0 refills | Status: DC

## 2017-08-05 MED ORDER — AZITHROMYCIN 250 MG PO TABS: ORAL_TABLET | ORAL | 0 refills | Status: DC

## 2017-08-05 NOTE — Telephone Encounter (Signed)
Spoke with pt, she states she is feeling ok but still have these symptoms  Congestion, headaches, blowing nose with clear/thick mucus, SOB, face is sore, coughing  Took some Nyquil and  Alka sellzer plus cold for the symptoms. MW please advise, pt would like something called in.   [facility]

## 2017-08-05 NOTE — Telephone Encounter (Signed)
zpak Prednisone 10 mg take  4 each am x 2 days,   2 each am x 2 days,  1 each am x 2 days and stop  Stop smoking  F/u one week if not back to baseline

## 2017-08-05 NOTE — Telephone Encounter (Signed)
Spoke with pt, aware of recs.  rx's sent to preferred pharmacy.  Nothing further needed.  

## 2017-08-27 ENCOUNTER — Encounter: Payer: Self-pay | Admitting: Internal Medicine

## 2017-08-27 ENCOUNTER — Other Ambulatory Visit: Payer: Self-pay | Admitting: Internal Medicine

## 2017-08-27 ENCOUNTER — Ambulatory Visit: Payer: Medicaid Other | Admitting: Internal Medicine

## 2017-08-27 VITALS — BP 124/80 | HR 88 | Ht 59.0 in | Wt 214.4 lb

## 2017-08-27 DIAGNOSIS — J449 Chronic obstructive pulmonary disease, unspecified: Secondary | ICD-10-CM

## 2017-08-27 DIAGNOSIS — Z72821 Inadequate sleep hygiene: Secondary | ICD-10-CM | POA: Diagnosis not present

## 2017-08-27 DIAGNOSIS — F17210 Nicotine dependence, cigarettes, uncomplicated: Secondary | ICD-10-CM | POA: Diagnosis not present

## 2017-08-27 DIAGNOSIS — R058 Other specified cough: Secondary | ICD-10-CM | POA: Insufficient documentation

## 2017-08-27 DIAGNOSIS — R05 Cough: Secondary | ICD-10-CM | POA: Diagnosis not present

## 2017-08-27 DIAGNOSIS — R059 Cough, unspecified: Secondary | ICD-10-CM

## 2017-08-27 MED ORDER — PANTOPRAZOLE SODIUM 40 MG PO TBEC: 40.0000 mg | DELAYED_RELEASE_TABLET | Freq: Every day | ORAL | 11 refills | Status: DC

## 2017-08-27 NOTE — Progress Notes (Signed)
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 Pulmonary Consult    Referred by Dr. Colvin CaroliKaren House. Pt states that she was dxed with COPD in 2009. She states that she gets winded walking short distances such as from room to room at home. She also c/o cough- worse at night and first thing in the am- clear sputum.    uses neb first thing in am then symbicort w/in half an hour which was about 2 h prior to OV   Last pred was nov 2017 ? Benef[facility]  Wakes up congested/ wheezy but no purulent sputum Doe= MMRC3 = can't walk 100 yards even at a slow pace at a flat grade s stopping due to sob  Onset was indolent and pattern is progressive and parallels wt gain also Worse breathing when exp to strong odors/ heat and humidity  rec Plan A = Automatic = Bevespi Take 2 puffs first thing in am and then another 2 puffs about 12 hours later.  Work on inhaler technique:  Plan B = Backup Only use your albuterol (proair) Plan C = Crisis - only use your albuterol nebulizer if you first try Plan B and it fails to help > ok to use the nebulizer up to every 4 hours but if start needing it regularly call for immediate appointment Take Prednisone 10 mg x 4 for three days 3 for three days 2 for three days 1 for three days and stop    06/02/2017  f/u ov/Debra Page re:  GOLD III/ still smoking  Chief Complaint  Patient presents with  . Follow-up    SOB, chest tightness after taking prednison  doe  = MMRC3 = can't walk 100 yards even at a slow pace at a flat grade s stopping due to sob Sleeping on L side down / no 02    Minimal am mucus but worse coughing fits daytime since last ov and no better p pred or changing back to symb 160  rec Plan A = Automatic = symbicort 160 x 2pffs then spiriva x 2 pffs first thing in am and repeat symb x 2 puffs in  am  Pantoprazole (protonix) 40 mg   Take  30-60 min before first meal of the day and Pepcid (famotidine)  20 mg one @  bedtime until return to office - this is the best way to tell whether stomach acid is contributing to your problem.   GERD diet  Plan B = Backup Only use your albuterol as a rescue medication Plan C = Crisis - only use your albuterol nebulizer if you first try Plan B and it fails to help > ok to use the nebulizer up to every 4 hours but if start needing it regularly call for immediate appointment The key is to stop smoking completely before smoking completely stops you - it's a matter of life and breath For cough >  mucinex dm up to 1200 mg every 12 hours and cough into the flutter valve      07/19/2017  Extended f/u ov/Debra Page re: GOLD  III/ still smoking / symb 160 no better on spiriva so stopped  Chief Complaint  Patient presents with  . Follow-up    went to health dept. last month and was told she had thrush and was placed on swish and swallow, it is not better, sinus pressure, not sleeping, sore throat, congestion, dry cough, sides and lower back sore from coughing  cough is some better with mucinex dm and flutter valve  For the last month has developed dysphagia/ ear pressure both sides equal > health dept > dx thrush/ pcn /nystatisn > no better at all / not using pepcid at hs over same period of time  Not needing an saba either hfa or neb Easily confused with details of care/ poor hfa  rec Plan A = Automatic = change to BEVESPI  Take 2 puffs first thing in am and then another 2 puffs about 12 hours later.  Pantoprazole  (protonix) 40 mg   Take  30-60 min before first meal of the day and Pepcid (famotidine)  20 mg one @  bedtime until return to office - this is the best way to tell whether stomach acid is contributing to your problem.   Plan B = Backup Only use your albuterol (proair)  as a rescue medication  Plan C = Crisis - only use your albuterol nebulizer if you first try Plan B  Clotrimazole troche 10 four times a day as needed for thrush     07/30/17 PC re increase cough and sore throat zpak Prednisone 10 mg take  4 each am x 2 days,   2 each am x 2 days,  1 each am x 2 days and stop  Stop smoking   08/27/2017  f/u ov/Debra Page re: GOLD III/ still smoking/ most of her complaints are upper airway  Chief Complaint  Patient presents with  . Follow-up    Pt states she still has pressure on her ears and also has some difficulty with breathing this AM. States she has been having to use ProAir inhaler 2-3 times a day.  also admits to using   neb three times a day  Never started ppi as rec and continues to be confused with details of care/ names of meds/ names of drugstores  Difficulty falling asleep - watches TV until hs  Baseline doe = MMRC2 = can't walk a nl pace on a flat grade s sob but does fine slow and flat eg shopping    No obvious patterns in  day to day or daytime variability or assoc excess/ purulent sputum or mucus plugs or hemoptysis or cp or chest tightness, subjective wheeze or overt sinus or hb symptoms. No unusual exposure hx or h/o childhood pna/ asthma or knowledge of premature birth.   Also denies any obvious fluctuation of symptoms with weather or environmental changes or other aggravating or alleviating factors except as outlined above   Current Allergies, Complete Past Medical History, Past Surgical History, Family History, and Social History were reviewed in [facility] electronic medical record.  ROS  The following are not active complaints unless bolded Hoarseness, sore throat,  dysphagia, dental problems, itching, sneezing,  nasal congestion or discharge of excess mucus or purulent secretions, ear ache,  fever, chills, sweats, unintended wt loss or wt gain, classically pleuritic or exertional cp,  orthopnea pnd  or leg swelling, presyncope, palpitations, abdominal pain, anorexia, nausea, vomiting, diarrhea  or change in bowel habits or change in bladder habits, change in stools or change in urine, dysuria, hematuria,  rash, arthralgias, visual complaints, headache, numbness, weakness or ataxia or problems with walking or coordination,  change in mood/affect or memory.        Current Meds  Medication Sig  . clotrimazole (MYCELEX) 10 MG troche Take 1 tablet (10 mg total) by mouth 4 (four) times daily as needed (thrush).  . famotidine (PEPCID) 20 MG tablet One at bedtime  . Glycopyrrolate-Formoterol (BEVESPI AEROSPHERE) 9-4.8 MCG/ACT AERO Inhale 2 puffs into the lungs 2 (two) times daily.  Marland Kitchen ipratropium-albuterol (DUONEB) 0.5-2.5 (3) MG/3ML SOLN 1 vial in neb every 6 hours as needed  . levothyroxine (SYNTHROID, LEVOTHROID) 75 MCG tablet Take 75 mcg by mouth daily.  Marland Kitchen PROAIR HFA 108 (90 Base) MCG/ACT inhaler Inhale 2 puffs into the lungs every 4 (four) hours as needed.  Marland Kitchen Respiratory Therapy Supplies (FLUTTER) DEVI 1 Device by Does not apply route as needed.  . simvastatin (ZOCOR) 20 MG tablet Take 20 mg by mouth at bedtime.                Objective:   Physical Exam  Hoarse amb somber obese wf nad  08/27/2017         214 07/19/2017     212  06/02/2017    210   04/20/17 207 lb 6.4 oz (94.1 kg)  01/23/12 187 lb (84.8 kg)     Vital signs reviewed - Note on arrival 02 sats  96% on RA     HEENT: nl dentition,  and oropharynx. Nl external ear canals without cough reflex - severe bilateral non-specific turbinate edema     NECK :  without JVD/Nodes/TM/ nl carotid upstrokes bilaterally   LUNGS: no acc muscle use,  Nl contour chest  With distant bs bilaterally but no  wheezes/ rhonchi    CV:  RRR  no s3 or murmur or increase in P2, and no edema   ABD:  Obese soft and nontender with limited inspiratory excursion in the supine position. No bruits or organomegaly appreciated, bowel sounds nl  MS:  Nl gait/ ext warm without deformities, calf tenderness, cyanosis or clubbing No obvious joint restrictions   SKIN: warm and dry without lesions    NEURO:  alert, approp, nl sensorium with  no motor or cerebellar deficits apparent.            Assessment:

## 2017-08-27 NOTE — Patient Instructions (Signed)
The key is to stop smoking completely before smoking completely stops you!    Do not nap during the day and read for 30 min before bed   Start Protonix 40 mg Take 30-60 min before first meal of the day  In addition to the pepcid at bedtime   GERD (REFLUX)  is an extremely common cause of respiratory symptoms just like yours , many times with no obvious heartburn at all.    It can be treated with medication, but also with lifestyle changes including elevation of the head of your bed (ideally with 6 inch  bed blocks),  Smoking cessation, avoidance of late meals, excessive alcohol, and avoid fatty foods, chocolate, peppermint, colas, red wine, and acidic juices such as orange juice.  NO MINT OR MENTHOL PRODUCTS SO NO COUGH DROPS   USE SUGARLESS CANDY INSTEAD (Jolley ranchers or Stover's or Life Savers) or even ice chips will also do - the key is to swallow to prevent all throat clearing. NO OIL BASED VITAMINS - use powdered substitutes.    Please see patient coordinator before you leave today  to schedule sinus CT    Please schedule a follow up office visit in 6 weeks, call sooner if needed

## 2017-08-28 ENCOUNTER — Encounter: Payer: Self-pay | Admitting: Internal Medicine

## 2017-08-28 DIAGNOSIS — Z72821 Inadequate sleep hygiene: Secondary | ICD-10-CM | POA: Insufficient documentation

## 2017-08-28 NOTE — Assessment & Plan Note (Signed)
>    5469m   I took an extended  opportunity with this patient to outline the consequences of continued cigarette use  in airway disorders based on all the data we have from the multiple national lung health studies (perfomed over decades at millions of dollars in cost)  indicating that smoking cessation, not choice of inhalers or physicians, is the most important aspect of her care.   Not willing to set quit date at this point.

## 2017-08-28 NOTE — Assessment & Plan Note (Addendum)
Spirometry 04/20/2017  FEV1 0.94 (39%)  Ratio 56 p am symb/duoneb w/in 2 h - 04/20/2017   Walked RA x one lap @ 185 stopped due to  Sob/ fast pace, no desat - 04/20/2017  After extensive coaching HFA effectiveness =    50%  > try bevespi 2bid  No better  - PFT's  06/02/2017  FEV1 0.79  (33 % ) ratio 45  p no % improvement from saba p symbicort x 2 pffs  prior to study with DLCO  66/66c % corrects to 86  % for alv volume   - added flutter valve 06/02/2017\ - [facility] Screening 06/02/17  MM  Level 158 - 06/02/2017    try spiriva smi 2 qam and gerd rx > no better / thrush on symbiocrt 160 @ ov 07/19/2017  - 07/19/2017    change back to bevespi 2 bid  - 08/27/2017  After extensive coaching inhaler device  effectiveness =    75% (short Ti)    Symptoms still not well controlled but not more upper than lower in nature so rec no change in rx = bevespi maint/ prn saba first with the hfa, then the neb as a backup as Pt is Group B in terms of symptom/risk and laba/lama therefore appropriate rx at this point.      see avs for instructions unique to this ov

## 2017-08-28 NOTE — Assessment & Plan Note (Signed)
Upper airway cough syndrome (previously labeled PNDS),  is so named because it's frequently impossible to sort out how much is  CR/sinusitis with freq throat clearing (which can be related to primary GERD)   vs  causing  secondary (" extra esophageal")  GERD from wide swings in gastric pressure that occur with throat clearing, often  promoting self use of mint and menthol lozenges that reduce the lower esophageal sphincter tone and exacerbate the problem further in a cyclical fashion.   These are the same pts (now being labeled as having "irritable larynx syndrome" by some cough centers) who not infrequently have a history of having failed to tolerate ace inhibitors,  dry powder inhalers or biphosphonates or report having atypical/extraesophageal reflux symptoms that don't respond to standard doses of PPI  and are easily confused as having aecopd or asthma flares by even experienced allergists/ pulmonologists (myself included).   rec max rx for gerd/ proceed with sinus CT   I had an extended discussion with the patient reviewing all relevant studies completed to date and  lasting 15 to 20 minutes of a 25 minute visit    Each maintenance medication was reviewed in detail including most importantly the difference between maintenance and prns and under what circumstances the prns are to be triggered using an action plan format that is not reflected in the computer generated alphabetically organized AVS.    Please see AVS for specific instructions unique to this visit that I personally wrote and verbalized to the the pt in detail and then reviewed with pt  by my nurse highlighting any  changes in therapy recommended at today's visit to their plan of care.

## 2017-08-28 NOTE — Assessment & Plan Note (Signed)
Reviewed approp sleep hygiene, would avoid medical rx for now and no indication for sleep study at this point   see avs for instructions unique to this ov

## 2017-08-28 NOTE — Assessment & Plan Note (Signed)
Body mass index is 43.3 kg/m.  -  trending up No results found for: TSH   Contributing to gerd risk/ doe/reviewed the need and the process to achieve and maintain neg calorie balance > defer f/u primary care including intermittently monitoring thyroid status

## 2017-09-10 ENCOUNTER — Ambulatory Visit (HOSPITAL_COMMUNITY): Payer: Medicaid Other

## 2017-09-21 ENCOUNTER — Ambulatory Visit (HOSPITAL_COMMUNITY)
Admission: RE | Admit: 2017-09-21 | Discharge: 2017-09-21 | Disposition: A | Discharge status: Home / Self Care | Payer: Medicaid Other | Source: Ambulatory Visit | Attending: Internal Medicine | Admitting: Internal Medicine

## 2017-09-21 DIAGNOSIS — R05 Cough: Secondary | ICD-10-CM

## 2017-09-21 DIAGNOSIS — R059 Cough, unspecified: Secondary | ICD-10-CM

## 2017-10-08 ENCOUNTER — Ambulatory Visit: Payer: Medicaid Other | Admitting: Internal Medicine

## 2017-10-18 ENCOUNTER — Encounter: Payer: Self-pay | Admitting: Internal Medicine

## 2017-10-18 ENCOUNTER — Ambulatory Visit: Payer: Medicaid Other | Admitting: Internal Medicine

## 2017-10-18 VITALS — BP 124/88 | HR 82 | Ht 59.5 in | Wt 220.0 lb

## 2017-10-18 DIAGNOSIS — F17210 Nicotine dependence, cigarettes, uncomplicated: Secondary | ICD-10-CM

## 2017-10-18 DIAGNOSIS — R05 Cough: Secondary | ICD-10-CM | POA: Diagnosis not present

## 2017-10-18 DIAGNOSIS — R058 Other specified cough: Secondary | ICD-10-CM

## 2017-10-18 DIAGNOSIS — J449 Chronic obstructive pulmonary disease, unspecified: Secondary | ICD-10-CM | POA: Diagnosis not present

## 2017-10-18 NOTE — Progress Notes (Signed)
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 Pulmonary Consult    Referred by Dr. Colvin CaroliKaren House. Pt states that she was dxed with COPD in 2009. She states that she gets winded walking short distances such as from room to room at home. She also c/o cough- worse at night and first thing in the am- clear sputum.    uses neb first thing in am then symbicort w/in half an hour which was about 2 h prior to OV   Last pred was nov 2017 ? Benefit  Wakes up congested/ wheezy but no purulent sputum Doe= MMRC3 = can't walk 100 yards even at a slow pace at a flat grade s stopping due to sob  Onset was indolent and pattern is progressive and parallels wt gain also Worse breathing when exp to strong odors/ heat and humidity  rec Plan A = Automatic = Bevespi Take 2 puffs first thing in am and then another 2 puffs about 12 hours later.  Work on inhaler technique:  Plan B = Backup Only use your albuterol (proair) Plan C = Crisis - only use your albuterol nebulizer if you first try Plan B and it fails to help > ok to use the nebulizer up to every 4 hours but if start needing it regularly call for immediate appointment Take Prednisone 10 mg x 4 for three days 3 for three days 2 for three days 1 for three days and stop    06/02/2017  f/u ov/Capri Veals re:  GOLD III/ still smoking  Chief Complaint  Patient presents with  . Follow-up    SOB, chest tightness after taking prednison  doe  = MMRC3 = can't walk 100 yards even at a slow pace at a flat grade s stopping due to sob Sleeping on L side down / no 02    Minimal am mucus but worse coughing fits daytime since last ov and no better p pred or changing back to symb 160  rec Plan A = Automatic = symbicort 160 x 2pffs then spiriva x 2 pffs first thing in am and repeat symb x 2 puffs in  am  Pantoprazole (protonix) 40 mg   Take  30-60 min before first meal of the day and Pepcid (famotidine)  20 mg one @  bedtime until return to office - this is the best way to tell whether stomach acid is contributing to your problem.   GERD diet  Plan B = Backup Only use your albuterol as a rescue medication Plan C = Crisis - only use your albuterol nebulizer if you first try Plan B and it fails to help > ok to use the nebulizer up to every 4 hours but if start needing it regularly call for immediate appointment The key is to stop smoking completely before smoking completely stops you - it's a matter of life and breath For cough >  mucinex dm up to 1200 mg every 12 hours and cough into the flutter valve      07/19/2017  Extended f/u ov/Burley Kopka re: GOLD  III/ still smoking / symb 160 no better on spiriva so stopped  Chief Complaint  Patient presents with  . Follow-up    went to health dept. last month and was told she had thrush and was placed on swish and swallow, it is not better, sinus pressure, not sleeping, sore throat, congestion, dry cough, sides and lower back sore from coughing  cough is some better with mucinex dm and flutter valve  For the last month has developed dysphagia/ ear pressure both sides equal > health dept > dx thrush/ pcn /nystatisn > no better at all / not using pepcid at hs over same period of time  Not needing an saba either hfa or neb Easily confused with details of care/ poor hfa  rec Plan A = Automatic = change to BEVESPI  Take 2 puffs first thing in am and then another 2 puffs about 12 hours later.  Pantoprazole  (protonix) 40 mg   Take  30-60 min before first meal of the day and Pepcid (famotidine)  20 mg one @  bedtime until return to office - this is the best way to tell whether stomach acid is contributing to your problem.   Plan B = Backup Only use your albuterol (proair)  as a rescue medication  Plan C = Crisis - only use your albuterol nebulizer if you first try Plan B  Clotrimazole troche 10 four times a day as needed for thrush     07/30/17 PC re increase cough and sore throat zpak Prednisone 10 mg take  4 each am x 2 days,   2 each am x 2 days,  1 each am x 2 days and stop  Stop smoking   08/27/2017  f/u ov/Abby Tucholski re: GOLD III/ still smoking/ most of her complaints are upper airway  Chief Complaint  Patient presents with  . Follow-up    Pt states she still has pressure on her ears and also has some difficulty with breathing this AM. States she has been having to use ProAir inhaler 2-3 times a day.  also admits to using   neb three times a day  Never started ppi as rec and continues to be confused with details of care/ names of meds/ names of drugstores  Difficulty falling asleep - watches TV until hs  Baseline doe = MMRC2 = can't walk a nl pace on a flat grade s sob but does fine slow and flat eg shopping  rec The key is to stop smoking completely before smoking completely stops you!  Do not nap during the day and read for 30 min before bed  Start Protonix 40 mg Take 30-60 min before first meal of the day  In addition to the pepcid at bedtime  GERD (REFLUX) diet    10/18/2017  f/u ov/Morgan Keinath re: GOLD III/ still smoking and overusing duoneb / no better nasal congestion  Chief Complaint  Patient presents with  . Follow-up    Breathing seems to be doing well. She states that she still has alot of nasal congestion.  She is using her albuterol inhaler 2 x daily and neb 3-5 x per wk.    Dyspnea:  better Cough: lots of am congestion > clear  Sleep: sitting up props up at 45  SABA use:   proair around lunch/ supper/ nebs  avg once a day too   No obvious day to day or daytime variability or assoc  purulent sputum or mucus plugs or hemoptysis or cp or chest tightness, subjective wheeze or overt   hb symptoms. No unusual exposure hx or h/o childhood pna/ asthma or knowledge of premature birth.  Sleeping ok 45 degrees hob without nocturnal  or early am exacerbation  of respiratory  c/o's or need for noct saba. Also denies any obvious fluctuation of symptoms with weather or environmental changes or other aggravating or alleviating factors except as outlined above   Current Allergies, Complete Past Medical History, Past Surgical History, Family History, and Social History were reviewed in [facility] electronic medical record.  ROS  The following are not active complaints unless bolded Hoarseness, sore throat, dysphagia, dental problems, itching, sneezing,  nasal congestion or discharge of excess mucus or purulent secretions, ear ache,   fever, chills, sweats, unintended wt loss or wt gain, classically pleuritic or exertional cp,  orthopnea pnd or leg swelling, presyncope, palpitations, abdominal pain, anorexia, nausea, vomiting, diarrhea  or change in bowel habits or change in bladder habits, change in stools or change in urine, dysuria, hematuria,  rash, arthralgias, visual complaints, headache, numbness, weakness or ataxia or problems with walking or coordination,  change in mood/affect or memory.        Current Meds  Medication Sig  . famotidine (PEPCID) 20 MG tablet One at bedtime  . Glycopyrrolate-Formoterol (BEVESPI AEROSPHERE) 9-4.8 MCG/ACT AERO Inhale 2 puffs into the lungs 2 (two) times daily.  Marland Kitchen. ibuprofen (ADVIL) 200 MG tablet Take 200 mg by mouth every 6 (six) hours as needed.  Marland Kitchen. ipratropium-albuterol (DUONEB) 0.5-2.5 (3) MG/3ML SOLN 1 vial in neb every 6 hours as needed  . levothyroxine (SYNTHROID, LEVOTHROID) 75 MCG tablet Take 75 mcg by mouth daily.  . pantoprazole  (PROTONIX) 40 MG tablet Take 1 tablet (40 mg total) by mouth daily. Take 30-60 min before first meal of the day  . PROAIR HFA 108 (90 Base) MCG/ACT inhaler Inhale 2 puffs into the lungs every 4 (four) hours as needed.  Marland Kitchen. Respiratory Therapy Supplies (FLUTTER) DEVI 1 Device by Does not apply route as needed.  . simvastatin (ZOCOR) 20 MG tablet Take 20 mg by mouth at bedtime.                  Objective:   Physical Exam  Obese amb wf nad/ nasal tone   10/18/2017      220  08/27/2017         214 07/19/2017     212  06/02/2017    210   04/20/17 207 lb 6.4 oz (94.1 kg)  01/23/12 187 lb (84.8 kg)     Vital signs reviewed - Note on arrival 02 sats  95% on RA      HEENT: nl dentition  and oropharynx. Nl external ear canals without cough reflex - moderatey severe  bilateral non-specific turbinate edema     NECK :  without JVD/Nodes/TM/ nl carotid upstrokes bilaterally   LUNGS: no acc muscle use,  Nl contour chest with distant bs bilaterally/ min exp rhonchi   CV:  RRR  no s3 or murmur or increase in P2, and no edema   ABD: mod obese/ nontender with limited inspiratory excursion in the supine position. No bruits or organomegaly appreciated, bowel sounds nl  MS:  Nl gait/ ext warm without deformities, calf tenderness, cyanosis or clubbing No obvious joint restrictions  SKIN: warm and dry without lesions    NEURO:  alert, approp, nl sensorium with  no motor or cerebellar deficits apparent.             Assessment:

## 2017-10-18 NOTE — Patient Instructions (Addendum)
The key is to stop smoking completely before smoking completely stops you!   Plan A = Automatic = bevesopi  Take 2 puffs first thing in am and then another 2 puffs about 12 hours later.   Work on inhaler technique:  relax and gently blow all the way out then take a nice smooth deep breath back in, triggering the inhaler at same time you start breathing in.  Hold for up to 5 seconds if you can. Blow out thru nose. Rinse and gargle with water when done      Plan B = Backup Only use your albuterol (proair) as a rescue medication to be used if you can't catch your breath by resting or doing a relaxed purse lip breathing pattern.  - The less you use it, the better it will work when you need it. - Ok to use the inhaler up to 2 puffs  every 4 hours if you must but call for appointment if use goes up over your usual need - Don't leave home without it !!  (think of it like the spare tire for your car)   Plan C = Crisis - only use your albuterol nebulizer if you first try Plan B and it fails to help > ok to use the nebulizer up to every 4 hours but if start needing it regularly call for immediate appointment   Please see patient coordinator before you leave today  to schedule ent eval     Please schedule a follow up visit in 3 months but call sooner if needed

## 2017-10-19 ENCOUNTER — Encounter: Payer: Self-pay | Admitting: Internal Medicine

## 2017-10-19 NOTE — Assessment & Plan Note (Signed)
>   3 min Discussed the risks and costs (both direct and indirect)  of smoking relative to the benefits of quitting but patient unwilling to commit at this point to a specific quit date.       

## 2017-10-19 NOTE — Assessment & Plan Note (Signed)
Spirometry 04/20/2017  FEV1 0.94 (39%)  Ratio 56 p am symb/duoneb w/in 2 h - 04/20/2017   Walked RA x one lap @ 185 stopped due to  Sob/ fast pace, no desat - 04/20/2017  After extensive coaching HFA effectiveness =    50%  > try bevespi 2bid  No better  - PFT's  06/02/2017  FEV1 0.79  (33 % ) ratio 45  p no % improvement from saba p symbicort x 2 pffs  prior to study with DLCO  66/66c % corrects to 86  % for alv volume   - added flutter valve 06/02/2017\ - [facility] Screening 06/02/17  MM  Level 158 - 06/02/2017    try spiriva smi 2 qam and gerd rx > no better / thrush on symbiocrt 160 @ ov 07/19/2017  - 07/19/2017    change back to bevespi 2 bid  - 10/18/2017  After extensive coaching inhaler device  effectiveness =    75% > continue bevespi  As Pt is Group B in terms of symptom/risk and laba/lama therefore appropriate rx at this point.       Still overusing saba both hfa and neb likely due to persistent rhinitis and smoking which may be related to one another/ advised (see separate a/p)   I had an extended discussion with the patient reviewing all relevant studies completed to date and  lasting 15 to 20 minutes of a 25 minute visit    Each maintenance medication was reviewed in detail including most importantly the difference between maintenance and prns and under what circumstances the prns are to be triggered using an action plan format that is not reflected in the computer generated alphabetically organized AVS.    Please see AVS for specific instructions unique to this visit that I personally wrote and verbalized to the the pt in detail and then reviewed with pt  by my nurse highlighting any  changes in therapy recommended at today's visit to their plan of care.

## 2017-10-19 NOTE — Assessment & Plan Note (Signed)
Sinus CT  09/21/17 Clear paranasal sinuses. Mild mucosal thickening slight narrowing of the infundibulum of the right ostiomeatal complex> referred to ent 10/18/2017

## 2017-12-29 ENCOUNTER — Telehealth: Payer: Self-pay | Admitting: Internal Medicine

## 2017-12-29 MED ORDER — GLYCOPYRROLATE-FORMOTEROL 9-4.8 MCG/ACT IN AERO: 2.0000 | INHALATION_SPRAY | Freq: Two times a day (BID) | RESPIRATORY_TRACT | 0 refills | Status: DC

## 2017-12-29 NOTE — Telephone Encounter (Signed)
Spoke with patient. She was requesting a refill on her Bevespi to be sent to [facility]. Advised patient that I would send this in for her.   Nothing else needed at time of call.

## 2018-01-18 ENCOUNTER — Ambulatory Visit: Payer: Medicaid Other | Admitting: Internal Medicine

## 2018-02-17 ENCOUNTER — Ambulatory Visit: Payer: Medicaid Other | Admitting: Internal Medicine

## 2018-02-22 ENCOUNTER — Other Ambulatory Visit: Payer: Self-pay | Admitting: Internal Medicine

## 2018-03-11 ENCOUNTER — Ambulatory Visit: Payer: Medicaid Other | Admitting: Internal Medicine

## 2018-03-11 ENCOUNTER — Telehealth: Payer: Self-pay | Admitting: Internal Medicine

## 2018-03-11 ENCOUNTER — Encounter: Payer: Self-pay | Admitting: Internal Medicine

## 2018-03-11 VITALS — BP 130/74 | HR 87 | Ht 59.5 in | Wt 224.0 lb

## 2018-03-11 DIAGNOSIS — J449 Chronic obstructive pulmonary disease, unspecified: Secondary | ICD-10-CM

## 2018-03-11 DIAGNOSIS — F17210 Nicotine dependence, cigarettes, uncomplicated: Secondary | ICD-10-CM

## 2018-03-11 MED ORDER — ALBUTEROL SULFATE 1.25 MG/3ML IN NEBU: 1.0000 | INHALATION_SOLUTION | Freq: Four times a day (QID) | RESPIRATORY_TRACT | 2 refills | Status: DC | PRN

## 2018-03-11 MED ORDER — ALBUTEROL SULFATE (2.5 MG/3ML) 0.083% IN NEBU: 2.5000 mg | INHALATION_SOLUTION | Freq: Four times a day (QID) | RESPIRATORY_TRACT | 12 refills | Status: DC | PRN

## 2018-03-11 NOTE — Assessment & Plan Note (Signed)
Spirometry 04/20/2017  FEV1 0.94 (39%)  Ratio 56 p am symb/duoneb w/in 2 h - 04/20/2017   Walked RA x one lap @ 185 stopped due to  Sob/ fast pace, no desat - 04/20/2017  After extensive coaching HFA effectiveness =    50%  > try bevespi 2bid  No better  - PFT's  06/02/2017  FEV1 0.79  (33 % ) ratio 45  p no % improvement from saba p symbicort x 2 pffs  prior to study with DLCO  66/66c % corrects to 86  % for alv volume   - added flutter valve 06/02/2017\ - [facility] Screening 06/02/17  MM  Level 158 - 06/02/2017    try spiriva smi 2 qam and gerd rx > no better / thrush on symbiocrt 160 @ ov 07/19/2017  - 07/19/2017    change back to bevespi 2 bid  - 03/11/2018  After extensive coaching inhaler device  effectiveness =   75% (short Ti) - 03/11/2018   Walked RA  2 laps @ 185 ft each stopped due to  Sob with sats ok/ relatively slow pace   Well compensated Pt is Group B in terms of symptom/risk and laba/lama therefore appropriate rx at this point but also limited by obesity/ conditioning and bothered by CB/ poor mucociliary fnx.   rec no change rx feasible, see smoking a/p   I had an extended discussion with the patient reviewing all relevant studies completed to date and  lasting 15 to 20 minutes of a 25 minute visit    See device teaching which extended face to face time for this visit.  Each maintenance medication was reviewed in detail including emphasizing most importantly the difference between maintenance and prns and under what circumstances the prns are to be triggered using an action plan format that is not reflected in the computer generated alphabetically organized AVS which I have not found useful in most complex patients, especially with respiratory illnesses  Please see AVS for specific instructions unique to this visit that I personally wrote and verbalized to the the pt in detail and then reviewed with pt  by my nurse highlighting any  changes in therapy recommended at today's visit  to their plan of care.

## 2018-03-11 NOTE — Assessment & Plan Note (Signed)
4-5 min discussion re active cigarette smoking in addition to office E&M  Ask about tobacco use:   onging Advise quitting   Advised mc function would improve w/in 6 weeks of d/c cigs and best option to rx  cough Assess willingness:  Not committed at this point Assist in quit attempt:  Per PCP when ready Arrange follow up:   Follow up per Primary Care planned

## 2018-03-11 NOTE — Progress Notes (Signed)
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 Pulmonary Consult    Referred by Dr. Colvin CaroliKaren House. Pt states that she was dxed with COPD in 2009. She states that she gets winded walking short distances such as from room to room at home. She also c/o cough- worse at night and first thing in the am- clear sputum.    uses neb first thing in am then symbicort w/in half an hour which was about 2 h prior to OV   Last pred was nov 2017 ? [facility]  Wakes up congested/ wheezy but no purulent sputum Doe= MMRC3 = can't walk 100 yards even at a slow pace at a flat grade s stopping due to sob  Onset was indolent and pattern is progressive and parallels wt gain also Worse breathing when exp to strong odors/ heat and humidity  rec Plan A = Automatic = Bevespi Take 2 puffs first thing in am and then another 2 puffs about 12 hours later.  Work on inhaler technique:  Plan B = Backup Only use your albuterol (proair) Plan C = Crisis - only use your albuterol nebulizer if you first try Plan B and it fails to help > ok to use the nebulizer up to every 4 hours but if start needing it regularly call for immediate appointment Take Prednisone 10 mg x 4 for three days 3 for three days 2 for three days 1 for three days and stop    06/02/2017  f/u ov/Debra Page re:  GOLD III/ still smoking  Chief Complaint  Patient presents with  . Follow-up    SOB, chest tightness after taking prednison  doe  = MMRC3 = can't walk 100 yards even at a slow pace at a flat grade s stopping due to sob Sleeping on L side down / no 02    Minimal am mucus but worse coughing fits daytime since last ov and no better p pred or changing back to symb 160  rec Plan A = Automatic = symbicort 160 x 2pffs then spiriva x 2 pffs first thing in am and repeat symb x 2 puffs in  am  Pantoprazole (protonix) 40 mg   Take  30-60 min before first meal of the day and Pepcid (famotidine)  20 mg one @  bedtime until return to office - this is the best way to tell whether stomach acid is contributing to your problem.   GERD diet  Plan B = Backup Only use your albuterol as a rescue medication Plan C = Crisis - only use your albuterol nebulizer if you first try Plan B and it fails to help > ok to use the nebulizer up to every 4 hours but if start needing it regularly call for immediate appointment The key is to stop smoking completely before smoking completely stops you - it's a matter of life and breath For cough >  mucinex dm up to 1200 mg every 12 hours and cough into the flutter valve      07/19/2017  Extended f/u ov/Debra Page re: GOLD  III/ still smoking / symb 160 no better on spiriva so stopped  Chief Complaint  Patient presents with  . Follow-up    went to health dept. last month and was told she had thrush and was placed on swish and swallow, it is not better, sinus pressure, not sleeping, sore throat, congestion, dry cough, sides and lower back sore from coughing  cough is some better with mucinex dm and flutter valve  For the last month has developed dysphagia/ ear pressure both sides equal > health dept > dx thrush/ pcn /nystatisn > no better at all / not using pepcid at hs over same period of time  Not needing an saba either hfa or neb Easily confused with details of care/ poor hfa  rec Plan A = Automatic = change to BEVESPI  Take 2 puffs first thing in am and then another 2 puffs about 12 hours later.  Pantoprazole  (protonix) 40 mg   Take  30-60 min before first meal of the day and Pepcid (famotidine)  20 mg one @  bedtime until return to office - this is the best way to tell whether stomach acid is contributing to your problem.   Plan B = Backup Only use your albuterol (proair)  as a rescue medication  Plan C = Crisis - only use your albuterol nebulizer if you first try Plan B  Clotrimazole troche 10 four times a day as needed for thrush     07/30/17 PC re increase cough and sore throat zpak Prednisone 10 mg take  4 each am x 2 days,   2 each am x 2 days,  1 each am x 2 days and stop  Stop smoking   08/27/2017  f/u ov/Debra Page re: GOLD III/ still smoking/ most of her complaints are upper airway  Chief Complaint  Patient presents with  . Follow-up    Pt states she still has pressure on her ears and also has some difficulty with breathing this AM. States she has been having to use ProAir inhaler 2-3 times a day.  also admits to using   neb three times a day  Never started ppi as rec and continues to be confused with details of care/ names of meds/ names of drugstores  Difficulty falling asleep - watches TV until hs  Baseline doe = MMRC2 = can't walk a nl pace on a flat grade s sob but does fine slow and flat eg shopping  rec The key is to stop smoking completely before smoking completely stops you!  Do not nap during the day and read for 30 min before bed  Start Protonix 40 mg Take 30-60 min before first meal of the day  In addition to the pepcid at bedtime  GERD (REFLUX) diet    10/18/2017  f/u ov/Debra Page re: GOLD III/ still smoking and overusing duoneb / no better nasal congestion  Chief Complaint  Patient presents with  . Follow-up    Breathing seems to be doing well. She states that she still has alot of nasal congestion.  She is using her albuterol inhaler 2 x daily and neb 3-5 x per wk.    Dyspnea:  better Cough: lots of am congestion > clear  Sleep: sitting up props up at 45  SABA use:   proair around lunch/ supper/ nebs  avg once a day too  rec The key is to stop smoking completely before smoking completely stops you!  Plan A = Automatic = bevesopi  Take 2 puffs first thing in am and then another 2 puffs about 12 hours later.  Work on inhaler technique: Plan B = Backup Only use your albuterol (proair) as a rescue medication   Plan C = Crisis - only use your albuterol nebulizer if you first try Plan B and it fails to help > ok to use the nebulizer up to every 4 hours but if start needing it regularly call for immediate appointment Please see patient coordinator before you leave today  to schedule ent eval    03/11/2018  f/u ov/Debra Page re: copd III/ group B still smoking  Chief Complaint  Patient presents with  . Follow-up    Cough and SOB are unchanged. She is using proair 2-3 x per day and duoneb 3-4 x per wk on average.   Dyspnea:  MMRC3 = can't walk 100 yards even at a slow pace at a flat grade s stopping due to sob   Cough: worse in am's thick and white / not using mucinex or flutter as rec  Sleeping: 45 degrees 2-3 am needs rx  SABA use: down some 02: no   No obvious day to day or daytime variability or assoc   purulent sputum or mucus plugs or hemoptysis or cp or chest tightness, subjective wheeze or overt sinus or hb symptoms.     Also denies any obvious fluctuation of symptoms with weather or environmental changes or other aggravating or alleviating factors except as outlined above   No unusual exposure hx or h/o childhood pna/ asthma or knowledge of premature birth.  Current Allergies, Complete Past Medical History, Past Surgical History, Family History, and Social History were reviewed in [facility] electronic [facility] record.  ROS  The following are not active complaints unless bolded Hoarseness, sore throat, dysphagia, dental problems, itching, sneezing,  nasal congestion or discharge of excess mucus or purulent secretions, ear ache,   fever, chills,  sweats, unintended wt loss or wt gain, classically pleuritic or exertional cp,  orthopnea pnd or arm/hand swelling  or leg swelling, presyncope, palpitations, abdominal pain, anorexia, nausea, vomiting, diarrhea  or change in bowel habits or change in bladder habits, change in stools or change in urine, dysuria, hematuria,  rash, arthralgias, visual complaints, headache, numbness, weakness or ataxia or problems with walking or coordination,  change in mood or  memory.        Current Meds  Medication Sig  . famotidine (PEPCID) 20 MG tablet One at bedtime  . Glycopyrrolate-Formoterol (BEVESPI AEROSPHERE) 9-4.8 MCG/ACT AERO Inhale 2 puffs into the lungs 2 (two) times daily.  Marland Kitchen. ibuprofen (ADVIL) 200 MG tablet Take 200 mg by mouth every 6 (six) hours as needed.  Marland Kitchen. levothyroxine (SYNTHROID, LEVOTHROID) 75 MCG tablet Take 75 mcg by mouth daily.  . pantoprazole (PROTONIX) 40 MG tablet Take 1 tablet (40 mg total) by mouth daily. Take 30-60 min before first meal of the day  . PROAIR HFA 108 (90 Base) MCG/ACT inhaler INHALE 1-2 PUFFS EVERY 4 TO 6 HOURS AS NEEDED FOR RESCUE  . Respiratory Therapy Supplies (FLUTTER) DEVI 1 Device by Does not apply route as needed.  . RESTASIS 0.05 % ophthalmic emulsion Apply 1 drop to eye 2 (two) times daily.  . simvastatin (ZOCOR) 20 MG tablet Take 20 mg by mouth at bedtime.  . [  ipratropium-albuterol (  DUONEB) 0.5-2.5 (3) MG/3ML SOLN 1 vial in neb every 6 hours as needed                          Objective:   Physical Exam  amb obese wf nad    03/11/2018      224  10/18/2017      220  08/27/2017         214 07/19/2017     212  06/02/2017    210   04/20/17 207 lb 6.4 oz (94.1 kg)  01/23/12 187 lb (84.8 kg)    Vital signs reviewed - Note on arrival 02 sats  93% on RA    NECK :  without JVD/Nodes/TM/ nl carotid upstrokes bilaterally   LUNGS: no acc muscle use,  Nl contour chest distant bs  bilaterally without cough on insp or exp maneuvers   CV:  RRR   no s3 or murmur or increase in P2, and no edema   ABD:  Obese soft and nontender with limited  inspiratory excursion in the supine position. No bruits or organomegaly appreciated, bowel sounds nl  MS:  Nl gait/ ext warm without deformities, calf tenderness, cyanosis or clubbing No obvious joint restrictions   SKIN: warm and dry without lesions    NEURO:  alert, approp, nl sensorium with  no motor or cerebellar deficits apparent.        Assessment:

## 2018-03-11 NOTE — Telephone Encounter (Signed)
LMTCB

## 2018-03-11 NOTE — Patient Instructions (Addendum)
The key is to stop smoking completely before smoking completely stops you!   Change your neb to albuterol (not containing ipatropium) up to every 4 hours if your proair inhaler doesn't relieve you   Work on inhaler technique:  relax and gently blow all the way out then take a nice smooth deep breath back in, triggering the inhaler at same time you start breathing in.  Hold for up to 5 seconds if you can. Blow bevesopi out thru nose. Rinse and gargle with water when done  For cough > mucinex dm 1200 mg every 12 hours and use flutter as much as possible   Please schedule a follow up visit in 3 months but call sooner if needed

## 2018-03-13 ENCOUNTER — Encounter: Payer: Self-pay | Admitting: Internal Medicine

## 2018-03-13 NOTE — Assessment & Plan Note (Signed)
Body mass index is 44.49 kg/m.  -  trending up No results found for: TSH   Contributing to gerd risk/ doe/reviewed the need and the process to achieve and maintain neg calorie balance > defer f/u primary care including intermittently monitoring thyroid status

## 2018-03-15 NOTE — Telephone Encounter (Signed)
Spoke with patient-she is aware that 75ml is equal to 1 box and she has additional refills on hand. Pt understands that albuterol neb is used as needed and if she is using more than usual she needs to contact our office for an apt to reassess her condition. Nothing more needed at this time.

## 2018-04-22 ENCOUNTER — Other Ambulatory Visit: Payer: Self-pay | Admitting: Internal Medicine

## 2018-04-28 ENCOUNTER — Other Ambulatory Visit: Payer: Self-pay | Admitting: Internal Medicine

## 2018-06-13 ENCOUNTER — Ambulatory Visit: Payer: Medicaid Other | Admitting: Internal Medicine

## 2018-06-16 ENCOUNTER — Telehealth: Payer: Self-pay | Admitting: Internal Medicine

## 2018-06-16 NOTE — Telephone Encounter (Signed)
Spoke with Lorie with the [facility]. She is requesting to have the pt's last 2 OV notes faxed to her at [phone]. These have been faxed. Nothing further was needed.

## 2018-06-21 ENCOUNTER — Other Ambulatory Visit: Payer: Self-pay | Admitting: Internal Medicine

## 2018-06-24 ENCOUNTER — Other Ambulatory Visit: Payer: Self-pay | Admitting: Internal Medicine

## 2018-06-27 ENCOUNTER — Ambulatory Visit: Payer: Medicaid Other | Admitting: Internal Medicine

## 2018-06-27 ENCOUNTER — Encounter: Payer: Self-pay | Admitting: Internal Medicine

## 2018-06-27 DIAGNOSIS — J449 Chronic obstructive pulmonary disease, unspecified: Secondary | ICD-10-CM | POA: Diagnosis not present

## 2018-06-27 DIAGNOSIS — F17210 Nicotine dependence, cigarettes, uncomplicated: Secondary | ICD-10-CM | POA: Diagnosis not present

## 2018-06-27 MED ORDER — GUAIFENESIN ER 600 MG PO TB12: 1200.0000 mg | ORAL_TABLET | Freq: Two times a day (BID) | ORAL | 11 refills | Status: DC | PRN

## 2018-06-27 NOTE — Progress Notes (Signed)
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 Pulmonary Consult    Referred by Dr. Colvin Caroli. Pt states that she was dxed with COPD in 2009. She states that she gets winded walking short distances such as from room to room at home. She also c/o cough- worse at night and first thing in the am- clear sputum.    uses neb first thing in am then symbicort w/in half an hour which was about 2 h prior to OV   Last pred was nov 2017 ? Benef[facility]  Wakes up congested/ wheezy but no purulent sputum Doe= MMRC3 = can't walk 100 yards even at a slow pace at a flat grade s stopping due to sob  Onset was indolent and pattern is progressive and parallels wt gain also Worse breathing when exp to strong odors/ heat and humidity  rec Plan A = Automatic = Bevespi Take 2 puffs first thing in am and then another 2 puffs about 12 hours later.  Work on inhaler technique:  Plan B = Backup Only use your albuterol (proair) Plan C = Crisis - only use your albuterol nebulizer if you first try Plan B and it fails to help > ok to use the nebulizer up to every 4 hours but if start needing it regularly call for immediate appointment Take Prednisone 10 mg x 4 for three days 3 for three days 2 for three days 1 for three days and stop      03/11/2018  f/u ov/Wert re: copd III/ group B still smoking  Chief Complaint  Patient presents with  . Follow-up    Cough and SOB are unchanged. She is  using proair 2-3 x per day and duoneb 3-4 x per wk on average.   Dyspnea:  MMRC3 = can't walk 100 yards even at a slow pace at a flat grade s stopping due to sob   Cough: worse in am's thick and white / not using mucinex or flutter as rec  Sleeping: 45 degrees 2-3 am needs rx  SABA use: down some 02: no rec The key is to stop smoking completely before smoking completely stops you!  Change your neb to albuterol (not containing ipatropium) up to every 4 hours if your proair inhaler doesn't relieve you Work on inhaler technique:   For cough > mucinex dm 1200 mg every 12 hours and use flutter as much as possible  Please schedule a follow up visit in 3 months but call sooner if needed     06/27/2018  f/u ov/Wert re: GOLD III / group B symptoms  Chief Complaint  Patient presents with  . Follow-up    Breathing is unchanged. She is using her albuterol inhaler 3-4 x per day and neb with albuterol 3-4 x per wk.   Dyspnea:  MMRC3 = can't walk 100 yards even at a slow pace at a flat  grade s stopping due to sob   Cough: not as much of a problem but mucus is  thick / white esp in am >> not using mucinex / flutter as rec  Sleeping: 45 degrees on side  SABA use: as above/ still struggling with hfa  02: no    No obvious day to day or daytime variability or assoc  purulent sputum or mucus plugs or hemoptysis or cp or chest tightness, subjective wheeze or overt sinus or hb symptoms.   Sleeping as aove  without nocturnal  or early am exacerbation  of respiratory  c/o's or need for noct saba. Also denies any obvious fluctuation of symptoms with weather or environmental changes or other aggravating or alleviating factors except as outlined above   No unusual exposure hx or h/o childhood pna/ asthma or knowledge of premature birth.  Current Allergies, Complete Past Medical History, Past Surgical History, Family History, and Social History were reviewed in [employer] record.  ROS   The following are not active complaints unless bolded Hoarseness, sore throat, dysphagia, dental problems, itching, sneezing,  nasal congestion or discharge of excess mucus or purulent secretions, ear ache,   fever, chills, sweats, unintended wt loss or wt gain, classically pleuritic or exertional cp,  orthopnea pnd or arm/hand swelling  or leg swelling, presyncope, palpitations, abdominal pain, anorexia, nausea, vomiting, diarrhea  or change in bowel habits or change in bladder habits, change in stools or change in urine, dysuria, hematuria,  rash, arthralgias, visual complaints, headache, numbness, weakness or ataxia or problems with walking or coordination,  change in mood or  memory.        Current Meds  Medication Sig  . albuterol (PROVENTIL) (2.5 MG/3ML) 0.083% nebulizer solution Take 3 mLs (2.5 mg total) by nebulization every 6 (six) hours as needed for wheezing or shortness of breath.  Marland Kitchen BEVESPI AEROSPHERE 9-4.8 MCG/ACT AERO INHALE TWO PUFFS INTO THE LUNGS TWICE DAILY.  . famotidine (PEPCID) 20 MG tablet TAKE ONE TABLET BY MOUTH AT BEDTIME  . ibuprofen (ADVIL) 200 MG tablet Take 200 mg by mouth every 6 (six) hours as needed.  Marland Kitchen ipratropium-albuterol (DUONEB) 0.5-2.5 (3) MG/3ML SOLN INHALE CONTENTS OF 1 VIAL IN NEBULIZER EVERY 4 TO 6 HOURS AS NEEDED.  Marland Kitchen levothyroxine (SYNTHROID, LEVOTHROID) 75 MCG tablet Take 75 mcg by mouth daily.  Marland Kitchen oxybutynin (DITROPAN) 5 MG tablet Take 5 mg by mouth 2 (two) times daily.  . pantoprazole (PROTONIX) 40 MG tablet Take 1 tablet (40 mg total) by mouth daily. Take 30-60 min before first meal of the day  . PAZEO 0.7 % SOLN As directed  . PROAIR HFA 108 (90 Base) MCG/ACT inhaler INHALE 1-2 PUFFS EVERY 4 TO 6 HOURS AS NEEDED FOR RESCUE  . Respiratory Therapy Supplies (FLUTTER) DEVI 1 Device by Does not apply route as needed.  . RESTASIS 0.05 % ophthalmic emulsion Apply 1 drop to eye 2 (two) times daily.  . simvastatin (ZOCOR) 20 MG tablet Take 20 mg by mouth at  bedtime.               Objective:   Physical Exam    amb obese wf nad   06/27/2018      225  03/11/2018      224  10/18/2017      220  08/27/2017         214 07/19/2017     212  06/02/2017    210   04/20/17 207 lb 6.4  oz (94.1 kg)  01/23/12 187 lb (84.8 kg)      Vital signs reviewed - Note on arrival 02 sats  93% on RA        HEENT: nl dentition / oropharynx. Nl external ear canals without cough reflex -  Mild bilateral non-specific turbinate edema     NECK :  without JVD/Nodes/TM/ nl carotid upstrokes bilaterally   LUNGS: no acc muscle use,  Mod barrel  contour chest wall with bilateral  Distant bs s audible wheeze and  without cough on insp or exp maneuver and mod  Hyperresonant  to  percussion bilaterally     CV:  RRR  no s3 or murmur or increase in P2, and  L > R  Ankle Edema with pitting despite stockings   ABD:  soft and nontender with pos mid insp Hoover's  in the supine position. No bruits or organomegaly appreciated, bowel sounds nl  MS:   Nl gait/  ext warm without deformities, calf tenderness, cyanosis or clubbing No obvious joint restrictions   SKIN: warm and dry without lesions    NEURO:  alert, approp, nl sensorium with  no motor or cerebellar deficits apparent.             Assessment:

## 2018-06-27 NOTE — Assessment & Plan Note (Addendum)
Spirometry 04/20/2017  FEV1 0.94 (39%)  Ratio 56 p am symb/duoneb w/in 2 h - 04/20/2017   Walked RA x one lap @ 185 stopped due to  Sob/ fast pace, no desat - 04/20/2017  After extensive coaching HFA effectiveness =    50%  > try bevespi 2bid  No better  - PFT's  06/02/2017  FEV1 0.79  (33 % ) ratio 45  p no % improvement from saba p symbicort x 2 pffs  prior to study with DLCO  66/66c % corrects to 86  % for alv volume   - added flutter valve 06/02/2017\ - [facility] Screening 06/02/17  MM  Level 158 - 06/02/2017    try spiriva smi 2 qam and gerd rx > no better / thrush on symbiocrt 160 @ ov 07/19/2017  - 07/19/2017    change back to bevespi 2 bid  - 03/11/2018   Walked RA  2 laps @ 185 ft each stopped due to  Sob with sats ok/ relatively slow pace   06/27/2018  After extensive coaching inhaler device,  effectiveness =   75% from a baseline of < 50%   If not doing better with hfa when finishes present full cannister of bevespi, will explore peformist/yupelri per neb as Pt is Group B in terms of symptom/risk and laba/lama therefore appropriate rx at this point.

## 2018-06-27 NOTE — Assessment & Plan Note (Addendum)
4-5 min discussion re active cigarette smoking in addition to office E&M  Ask about tobacco use:   ongoing Advise quitting   > 3 min Discussed the risks and costs (both direct and indirect)  of smoking relative to the benefits of quitting but patient unwilling to commit at this point to a specific quit date.    Assess willingness:  Not committed at this point Assist in quit attempt:  Per PCP when ready Arrange follow up:   Follow up per Primary Care planned       

## 2018-06-27 NOTE — Assessment & Plan Note (Addendum)
Body mass index is 44.72 kg/m.  -  trending no change  No results found for: TSH   Contributing to gerd risk/ doe/reviewed the need and the process to achieve and maintain neg calorie balance > defer f/u primary care including intermittently monitoring thyroid status      I had an extended discussion with the patient reviewing all relevant studies completed to date and  lasting 15 to 20 minutes of a 25 minute visit    See device teaching which extended face to face time for this visit.  Each maintenance medication was reviewed in detail including emphasizing most importantly the difference between maintenance and prns and under what circumstances the prns are to be triggered using an action plan format that is not reflected in the computer generated alphabetically organized AVS which I have not found useful in most complex patients, especially with respiratory illnesses  Please see AVS for specific instructions unique to this visit that I personally wrote and verbalized to the the pt in detail and then reviewed with pt  by my nurse highlighting any  changes in therapy recommended at today's visit to their plan of care.

## 2018-06-27 NOTE — Patient Instructions (Addendum)
Finish your bevespi as we showed you today and if not making progress call before you run out for the same medication in nebulizer (performist and yupelri)   The key is to stop smoking completely before smoking completely stops you!    For cough > mucinex   1200 mg every 12 hours and use the flutter valve    Please schedule a follow up visit in 3 months but call sooner if needed

## 2018-07-25 ENCOUNTER — Other Ambulatory Visit: Payer: Self-pay | Admitting: Internal Medicine

## 2018-08-02 ENCOUNTER — Other Ambulatory Visit: Payer: Self-pay | Admitting: Internal Medicine

## 2018-08-02 NOTE — Telephone Encounter (Signed)
I have this refill request for Duoneb- however, she has this and plain albuterol on her list  Which do you want her to have? Please advise thanks

## 2018-08-26 ENCOUNTER — Other Ambulatory Visit: Payer: Self-pay | Admitting: Internal Medicine

## 2018-09-27 ENCOUNTER — Ambulatory Visit: Payer: Medicaid Other | Admitting: Internal Medicine

## 2018-09-30 ENCOUNTER — Other Ambulatory Visit: Payer: Self-pay | Admitting: Internal Medicine

## 2018-10-05 ENCOUNTER — Ambulatory Visit: Payer: Medicaid Other | Admitting: Internal Medicine

## 2018-10-13 ENCOUNTER — Other Ambulatory Visit: Payer: Self-pay | Admitting: Internal Medicine

## 2018-10-24 ENCOUNTER — Encounter: Payer: Self-pay | Admitting: Internal Medicine

## 2018-10-24 ENCOUNTER — Ambulatory Visit (INDEPENDENT_AMBULATORY_CARE_PROVIDER_SITE_OTHER): Payer: Medicaid Other | Admitting: Internal Medicine

## 2018-10-24 ENCOUNTER — Other Ambulatory Visit: Payer: Self-pay | Admitting: Internal Medicine

## 2018-10-24 VITALS — BP 124/80 | HR 81 | Ht 60.0 in | Wt 224.2 lb

## 2018-10-24 DIAGNOSIS — F17210 Nicotine dependence, cigarettes, uncomplicated: Secondary | ICD-10-CM | POA: Diagnosis not present

## 2018-10-24 DIAGNOSIS — J449 Chronic obstructive pulmonary disease, unspecified: Secondary | ICD-10-CM | POA: Diagnosis not present

## 2018-10-24 MED ORDER — REVEFENACIN 175 MCG/3ML IN SOLN: 1.0000 | Freq: Every day | RESPIRATORY_TRACT | 11 refills | Status: DC

## 2018-10-24 MED ORDER — FORMOTEROL FUMARATE 20 MCG/2ML IN NEBU: 20.0000 ug | INHALATION_SOLUTION | Freq: Two times a day (BID) | RESPIRATORY_TRACT | 11 refills | Status: DC

## 2018-10-24 NOTE — Progress Notes (Signed)
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 Pulmonary Consult    Referred by Dr. Colvin Caroli. Pt states that she was dxed with COPD in 2009. She states that she gets winded walking short distances such as from room to room at home. She also c/o cough- worse at night and first thing in the am- clear sputum.    uses neb first thing in am then symbicort w/in half an hour which was about 2 h prior to OV   Last pred was nov 2017 ? [facility]  Wakes up congested/ wheezy but no purulent sputum Doe= MMRC3 = can't walk 100 yards even at a slow pace at a flat grade s stopping due to sob  Onset was indolent and pattern is progressive and parallels wt gain also Worse breathing when exp to strong odors/ heat and humidity  rec Plan A = Automatic = Bevespi Take 2 puffs first thing in am and then another 2 puffs about 12 hours later.  Work on inhaler technique:  Plan B = Backup Only use your albuterol (proair) Plan C = Crisis - only use your albuterol nebulizer if you first try Plan B and it fails to help > ok to use the nebulizer up to every 4 hours but if start needing it regularly call for immediate appointment Take Prednisone 10 mg x 4 for three days 3 for three days 2 for three days 1 for three days and stop      03/11/2018  f/u ov/Wert re: copd III/ group B still smoking  Chief Complaint  Patient presents with  . Follow-up    Cough and SOB are unchanged. She is  using proair 2-3 x per day and duoneb 3-4 x per wk on average.   Dyspnea:  MMRC3 = can't walk 100 yards even at a slow pace at a flat grade s stopping due to sob   Cough: worse in am's thick and white / not using mucinex or flutter as rec  Sleeping: 45 degrees 2-3 am needs rx  SABA use: down some 02: no rec The key is to stop smoking completely before smoking completely stops you!  Change your neb to albuterol (not containing ipatropium) up to every 4 hours if your proair inhaler doesn't relieve you Work on inhaler technique:   For cough > mucinex dm 1200 mg every 12 hours and use flutter as much as possible  Please schedule a follow up visit in 3 months but call sooner if needed     06/27/2018  f/u ov/Wert re: GOLD III / group B symptoms  Chief Complaint  Patient presents with  . Follow-up    Breathing is unchanged. She is using her albuterol inhaler 3-4 x per day and neb with albuterol 3-4 x per wk.   Dyspnea:  MMRC3 = can't walk 100 yards even at a slow pace at a flat  grade s stopping due to sob   Cough: not as much of a problem but mucus is  thick / white esp in am >> not using mucinex / flutter as rec  Sleeping: 45 degrees on side  SABA use: as above/ still struggling with hfa  rec Finish your bevespi as we showed you today and if not making progress call before you run out for the same medication in nebulizer (performist and yupelri) The key is to stop smoking completely before smoking completely stops you!  For cough > mucinex   1200 mg every 12 hours and use the flutter valve     10/24/2018  f/u ov/Wert re: GOLD III/ still smoking/ maint on bevespi Chief Complaint  Patient presents with  . Follow-up    Breathing is unchanged. She is using her albuterol inhaler 3 x per wk and neb with albuterol 2 x per day on average.    Dyspnea:  MMRC3 = can't walk 100 yards even at a slow pace at a flat grade s stopping due to sob   Cough: usually at hs min mucoid Sleeping: uncomfortable  due to back problems  SABA use: as above    No obvious day to day or daytime variability or assoc excess/ purulent sputum or mucus plugs or hemoptysis or cp or chest tightness, subjective wheeze or overt sinus or hb symptoms.    Also denies any obvious fluctuation of symptoms with weather or environmental changes or other aggravating or alleviating factors except as outlined above   No unusual exposure hx or h/o childhood pna/ asthma or knowledge of premature birth.  Current Allergies, Complete Past Medical History, Past Surgical History, Family History, and Social History were reviewed in [employer] record.  ROS  The following are not active complaints unless bolded Hoarseness, sore throat, dysphagia, dental problems, itching, sneezing,  nasal congestion or discharge of excess mucus or purulent secretions, ear ache,   fever, chills, sweats, unintended wt loss or wt gain, classically pleuritic or exertional cp,  orthopnea pnd or arm/hand swelling  or leg swelling, presyncope, palpitations, abdominal pain, anorexia, nausea, vomiting, diarrhea  or change in bowel habits or change in bladder habits, change in stools or change in urine, dysuria, hematuria,  rash, arthralgias/back pain , visual complaints, headache, numbness, weakness or ataxia or problems with walking or coordination,  change in mood or  memory.        Current Meds  Medication Sig  . albuterol (PROVENTIL) (2.5 MG/3ML) 0.083% nebulizer solution Take 3 mLs (2.5 mg total) by nebulization every 6 (six) hours as needed for wheezing or shortness of breath.  Marland Kitchen BEVESPI AEROSPHERE 9-4.8 MCG/ACT AERO INHALE TWO PUFFS INTO THE LUNGS TWICE DAILY.  . famotidine (PEPCID) 20 MG tablet TAKE ONE TABLET BY MOUTH AT BEDTIME  . ibuprofen (ADVIL) 200 MG tablet Take 200 mg by mouth every 6 (six) hours as needed.  Marland Kitchen levothyroxine (SYNTHROID, LEVOTHROID) 100 MCG tablet Take 100 mcg by mouth daily.  Marland Kitchen oxybutynin (DITROPAN) 5 MG  tablet Take 5 mg by mouth 3 (three) times daily.  . pantoprazole (PROTONIX) 40 MG tablet TAKE ONE TABLET BY MOUTH 30-60 MINUTES BEFORE THE FIRST MEAL OF THE DAY.  Marland Kitchen PAZEO 0.7 % SOLN As directed  . PROAIR HFA 108 (90 Base) MCG/ACT inhaler INHALE 1-2 PUFFS EVERY 4 TO 6 HOURS AS NEEDED FOR RESCUE  . Respiratory Therapy Supplies (FLUTTER) DEVI 1 Device by Does not apply route as needed.  . RESTASIS 0.05 %  ophthalmic emulsion Apply 1 drop to eye 2 (two) times daily.  . simvastatin (ZOCOR) 20 MG tablet Take 20 mg by mouth at bedtime.                    Objective:   Physical Exam    Obese amb wf nad    10/24/2018        224  06/27/2018      225  03/11/2018      224  10/18/2017      220  08/27/2017         214 07/19/2017     212  06/02/2017    210   04/20/17 207 lb 6.4 oz (94.1 kg)  01/23/12 187 lb (84.8 kg)      Vital signs reviewed - Note on arrival 02 sats  96% on RA      HEENT: nl dentition / oropharynx. Nl external ear canals without cough reflex -  Mild bilateral non-specific turbinate edema     NECK :  without JVD/Nodes/TM/ nl carotid upstrokes bilaterally   LUNGS: no acc muscle use,  Mod barrel  contour chest wall with bilateral insp/exp rhonchi  and  without cough on insp or exp maneuver and mod  Hyperresonant  to  percussion bilaterally     CV:  RRR  no s3 or murmur or increase in P2, and trace pitting edema both ankles   ABD:  Obese soft and nontender with pos mid insp Hoover's  in the supine position. No bruits or organomegaly appreciated, bowel sounds nl  MS:   Nl gait/  ext warm without deformities, calf tenderness, cyanosis or clubbing No obvious joint restrictions   SKIN: warm and dry without lesions    NEURO:  alert, approp, nl sensorium with  no motor or cerebellar deficits apparent.                   Assessment:

## 2018-10-24 NOTE — Patient Instructions (Signed)
Plan A = Automatic =  Bevespi  =  Yupelri once  each am and  performist twice daily   Plan B = Backup Only use your albuterol Proair) inhaler as a rescue medication to be used if you can't catch your breath by resting or doing a relaxed purse lip breathing pattern.  - The less you use it, the better it will work when you need it. - Ok to use the inhaler up to 2 puffs  every 4 hours if you must but call for appointment if use goes up over your usual need - Don't leave home without it !!  (think of it like the spare tire for your car)   Plan C = Crisis - only use your albuterol nebulizer if you first try Plan B and it fails to help > ok to use the nebulizer up to every 4 hours but if start needing it regularly call for immediate appointment   The key is to stop smoking completely before smoking completely stops you!  For smoking cessation classes call [phone]       Please schedule a follow up office visit in 6 weeks, call sooner if needed

## 2018-10-26 ENCOUNTER — Telehealth: Payer: Self-pay | Admitting: Internal Medicine

## 2018-10-26 ENCOUNTER — Encounter: Payer: Self-pay | Admitting: Internal Medicine

## 2018-10-26 NOTE — Assessment & Plan Note (Signed)
Active smoker Spirometry 04/20/2017  FEV1 0.94 (39%)  Ratio 56 p am symb/duoneb w/in 2 h - 04/20/2017   Walked RA x one lap @ 185 stopped due to  Sob/ fast pace, no desat - 04/20/2017  After extensive coaching HFA effectiveness =    50%  > try bevespi 2bid  No better  - PFT's  06/02/2017  FEV1 0.79  (33 % ) ratio 45  p no % improvement from saba p symbicort x 2 pffs  prior to study with DLCO  66/66c % corrects to 86  % for alv volume   - added flutter valve 06/02/2017\ - [facility] Screening 06/02/17  MM  Level 158 - 06/02/2017    try spiriva smi 2 qam and gerd rx > no better / thrush on symbiocrt 160 @ ov 07/19/2017  - 07/19/2017    changed back to bevespi 2 bid  - 03/11/2018   Walked RA  2 laps @ 185 ft each stopped due to  Sob with sats ok/ relatively slow pace  - 10/24/2018  After extensive coaching inhaler device,  effectiveness =    50% at most due to very short Ti so rec change  to Yupelri and Perforomist in nebulizer form  It is very frustrating that her breathing is getting so bad she cannot inhale her medications but continues to inhale cigarettes (see separate a/p) the only choice at this point is to switch her if possible over to the nebulized form of Lama and LABA and hold off on any steroids in any form for now

## 2018-10-26 NOTE — Telephone Encounter (Signed)
Spoke with patient. She stated that she needs a PA on her neb solutions Yupelri and Perforomist via [employer]. Advised her that I would go ahead and work on this. She verbalized understanding.    Called [employer] to start PA. Both PAs have been approved effective today until 09/26/19.   Called Pharmacist at [facility] and made them aware.   Called patient and made her aware. She verbalized understanding. Nothing further needed at time of call.

## 2018-10-26 NOTE — Assessment & Plan Note (Signed)

## 2018-11-08 ENCOUNTER — Other Ambulatory Visit: Payer: Self-pay | Admitting: Internal Medicine

## 2018-11-16 ENCOUNTER — Telehealth: Payer: Self-pay | Admitting: Internal Medicine

## 2018-11-16 MED ORDER — RANITIDINE HCL 150 MG PO TABS: 150.0000 mg | ORAL_TABLET | Freq: Every day | ORAL | 11 refills | Status: DC

## 2018-11-16 MED ORDER — CETIRIZINE HCL 10 MG PO CAPS: 10.0000 mg | ORAL_CAPSULE | Freq: Every day | ORAL | 11 refills | Status: AC

## 2018-11-16 NOTE — Telephone Encounter (Signed)
Zantac 150 mg at hs  #30 refill x 11 Zyrtec 10 mg one qd prn #30 refill x 11

## 2018-11-16 NOTE — Telephone Encounter (Signed)
Spoke with pt, she states that [facility] does not have the Pepcid and wanted to know if we could call in generic Zantac instead. She is also requesting a Rx to be sent in for Zyrtec. Please advise.   Patient Instructions by Nyoka Cowden, MD at 10/24/2018 2:15 PM  Author: Nyoka Cowden, MD Author Type: Physician Filed: 10/24/2018 1:52 PM  Note Status: Signed Cosign: Cosign Not Required Encounter Date: 10/24/2018  Editor: Nyoka Cowden, MD (Physician)    Plan A = Automatic =  Bevespi  =  Mikael Spray once  each am and  performist twice daily   Plan B = Backup Only use your albuterol Proair) inhaler as a rescue medication to be used if you can't catch your breath by resting or doing a relaxed purse lip breathing pattern.  - The less you use it, the better it will work when you need it. - Ok to use the inhaler up to 2 puffs  every 4 hours if you must but call for appointment if use goes up over your usual need - Don't leave home without it !!  (think of it like the spare tire for your car)   Plan C = Crisis - only use your albuterol nebulizer if you first try Plan B and it fails to help > ok to use the nebulizer up to every 4 hours but if start needing it regularly call for immediate appointment   The key is to stop smoking completely before smoking completely stops you!  For smoking cessation classes call [phone]      Please schedule a follow up office visit in 6 weeks, call sooner if needed

## 2018-11-16 NOTE — Telephone Encounter (Signed)
Called patient and made aware both zantac and zyrtec sent to her pharmacy. Nothing further needed.

## 2018-12-12 ENCOUNTER — Other Ambulatory Visit: Payer: Self-pay

## 2018-12-12 ENCOUNTER — Ambulatory Visit (INDEPENDENT_AMBULATORY_CARE_PROVIDER_SITE_OTHER): Payer: Medicaid Other | Admitting: Internal Medicine

## 2018-12-12 ENCOUNTER — Other Ambulatory Visit: Payer: Self-pay | Admitting: Internal Medicine

## 2018-12-12 DIAGNOSIS — F17210 Nicotine dependence, cigarettes, uncomplicated: Secondary | ICD-10-CM

## 2018-12-12 DIAGNOSIS — J449 Chronic obstructive pulmonary disease, unspecified: Secondary | ICD-10-CM

## 2018-12-12 NOTE — Patient Instructions (Signed)
Stop the ipatropium   Plan A = Automatic = performist 20 mcg first thing in am then yupelri to follow and repeat performist 12 hours later   Plan B = Backup Only use your albuterol inhaler as a rescue medication to be used if you can't catch your breath by resting or doing a relaxed purse lip breathing pattern.  - The less you use it, the better it will work when you need it. - Ok to use the inhaler up to 2 puffs  every 4 hours if you must but call for appointment if use goes up over your usual need - Don't leave home without it !!  (think of it like the spare tire for your car)   Plan C = Crisis - only use your albuterol nebulizer if you first try Plan B and it fails to help > ok to use the nebulizer up to every 4 hours but if start needing it regularly call for immediate appointment   Please schedule a follow up visit in 3 months but call sooner if needed  with all medications /inhalers/ solutions in hand so we can verify exactly what you are taking. This includes all medications from all doctors and over the counters

## 2018-12-12 NOTE — Progress Notes (Signed)
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 Pulmonary Consult    Referred by Dr. Colvin Caroli. Pt states that she was dxed with COPD in 2009. She states that she gets winded walking short distances such as from room to room at home. She also c/o cough- worse at night and first thing in the am- clear sputum.    uses neb first thing in am then symbicort w/in half an hour which was about 2 h prior to OV   Last pred was nov 2017 ? [facility]  Wakes up congested/ wheezy but no purulent sputum Doe= MMRC3 = can't walk 100 yards even at a slow pace at a flat grade s stopping due to sob  Onset was indolent and pattern is progressive and parallels wt gain also Worse breathing when exp to strong odors/ heat and humidity  rec Plan A = Automatic = Bevespi Take 2 puffs first thing in am and then another 2 puffs about 12 hours later.  Work on inhaler technique:  Plan B = Backup Only use your albuterol (proair) Plan C = Crisis - only use your albuterol nebulizer if you first try Plan B and it fails to help > ok to use the nebulizer up to every 4 hours but if start needing it regularly call for immediate appointment Take Prednisone 10 mg x 4 for three days 3 for three days 2 for three days 1 for three days and stop      03/11/2018  f/u ov/Zabella Wease re: copd III/ group B still smoking  Chief Complaint  Patient presents with  . Follow-up    Cough and SOB are unchanged. She is  using proair 2-3 x per day and duoneb 3-4 x per wk on average.   Dyspnea:  MMRC3 = can't walk 100 yards even at a slow pace at a flat grade s stopping due to sob   Cough: worse in am's thick and white / not using mucinex or flutter as rec  Sleeping: 45 degrees 2-3 am needs rx  SABA use: down some 02: no rec The key is to stop smoking completely before smoking completely stops you!  Change your neb to albuterol (not containing ipatropium) up to every 4 hours if your proair inhaler doesn't relieve you Work on inhaler technique:   For cough > mucinex dm 1200 mg every 12 hours and use flutter as much as possible  Please schedule a follow up visit in 3 months but call sooner if needed     06/27/2018  f/u ov/Jahmarion Popoff re: GOLD III / group B symptoms  Chief Complaint  Patient presents with  . Follow-up    Breathing is unchanged. She is using her albuterol inhaler 3-4 x per day and neb with albuterol 3-4 x per wk.   Dyspnea:  MMRC3 = can't walk 100 yards even at a slow pace at a flat  grade s stopping due to sob   Cough: not as much of a problem but mucus is  thick / white esp in am >> not using mucinex / flutter as rec  Sleeping: 45 degrees on side  SABA use: as above/ still struggling with hfa  rec Finish your bevespi as we showed you today and if not making progress call before you run out for the same medication in nebulizer (performist and yupelri) The key is to stop smoking completely before smoking completely stops you!  For cough > mucinex   1200 mg every 12 hours and use the flutter valve     10/24/2018  f/u ov/Grason Brailsford re: GOLD III/ still smoking/ maint on bevespi Chief Complaint  Patient presents with  . Follow-up    Breathing is unchanged. She is using her albuterol inhaler 3 x per wk and neb with albuterol 2 x per day on average.    Dyspnea:  MMRC3 = can't walk 100 yards even at a slow pace at a flat grade s stopping due to sob   Cough: usually at hs min mucoid Sleeping: uncomfortable  due to back problems  SABA use: as above  rec Plan A = Automatic =  Bevespi  =  Yupelri once  each am and  performist twice daily  Plan B = Backup Only use your albuterol (Proair) inhaler as a rescue medication  Plan C = Crisis - only use your albuterol nebulizer if you first try Plan B and it fails to help > ok to use the nebulizer up to every 4 hours  The key is to stop smoking completely before smoking completely stops you!      Virtual Visit via Telephone Note 12/12/2018  Re: copd III/ still smoking  I connected with Debra Page on 12/12/18 at  2:30 PM EDT by telephone and verified that I am speaking with the correct person using two identifiers.   I discussed the limitations, risks, security and privacy concerns of performing an evaluation and management service by telephone and the availability of in person appointments. I also discussed with the patient that there may be a patient responsible charge related to this service. The patient expressed understanding and agreed to proceed.   History of Present Illness:  Dyspnea:  Room to room = [facility] = can't walk 100 yards even at a slow pace at a flat grade s stopping due to sob   Cough: first thing in am/ clears p one hour, thick clear  Sleeping: propped 45 degrees with pillows/ flat  SABA use: gets thru most nocts s saba  02: none   No obvious day to day or daytime variability or assoc  purulent sputum or mucus plugs or hemoptysis or cp or chest tightness, subjective wheeze or overt sinus or hb symptoms.    Also denies any obvious fluctuation of symptoms with weather or environmental changes or other aggravating or alleviating factors except as outlined above.   Meds reviewed/ med reconciliation completed         Observations/Objective: slt gruff voice, talks in full phrases / no coughing during interview   Assessment and Plan: See problem list for active a/p's   Follow Up Instructions: See avs for instructions  unique to this ov which includes revised/ updated med list     I discussed the assessment and treatment plan with the patient. The patient was provided an opportunity to ask questions and all were answered. The patient agreed with the plan and  demonstrated an understanding of the instructions.   The patient was advised to call back or seek an in-person evaluation if the symptoms worsen or if the condition fails to improve as anticipated.  I provided 15 minutes of non-face-to-face time during this encounter.   Sandrea HughsMichael Marquest Gunkel, MD

## 2018-12-14 ENCOUNTER — Encounter: Payer: Self-pay | Admitting: Internal Medicine

## 2018-12-14 NOTE — Assessment & Plan Note (Signed)
Active smoker Spirometry 04/20/2017  FEV1 0.94 (39%)  Ratio 56 p am symb/duoneb w/in 2 h - 04/20/2017   Walked RA x one lap @ 185 stopped due to  Sob/ fast pace, no desat - 04/20/2017  After extensive coaching HFA effectiveness =    50%  > try bevespi 2bid  No better  - PFT's  06/02/2017  FEV1 0.79  (33 % ) ratio 45  p no % improvement from saba p symbicort x 2 pffs  prior to study with DLCO  66/66c % corrects to 86  % for alv volume   - added flutter valve 06/02/2017\ - [facility] Screening 06/02/17  MM  Level 158 - 06/02/2017    try spiriva smi 2 qam and gerd rx > no better / thrush on symbiocrt 160 @ ov 07/19/2017  - 07/19/2017    changed back to bevespi 2 bid  - 03/11/2018   Walked RA  2 laps @ 185 ft each stopped due to  Sob with sats ok/ relatively slow pace  - 10/24/2018  After extensive coaching inhaler device,  effectiveness =    50% at most due to very short Ti so rec change  to [place] and Perforomist in nebulizer form   Advised already on max rx but there is no way to overcome the effects of cigs by taking more meds so no change in maint rx needed other than d/c ipatropium completely as redundant with yupelri on board

## 2018-12-14 NOTE — Assessment & Plan Note (Signed)
Counseled re importance of smoking cessation but did not meet time criteria for separate billing on a not in person ov    Each maintenance medication was reviewed in detail including most importantly the difference between maintenance and as needed and under what circumstances the prns are to be used.  Please see AVS for specific  Instructions which are unique to this visit and I personally typed out  which were reviewed in detail in writing with the patient and a copy provided.

## 2019-01-12 ENCOUNTER — Other Ambulatory Visit: Payer: Self-pay | Admitting: Internal Medicine

## 2019-01-26 ENCOUNTER — Other Ambulatory Visit: Payer: Self-pay | Admitting: Internal Medicine

## 2019-02-11 ENCOUNTER — Other Ambulatory Visit: Payer: Self-pay | Admitting: Pulmonary Disease

## 2019-03-07 ENCOUNTER — Other Ambulatory Visit: Payer: Self-pay | Admitting: Internal Medicine

## 2019-03-17 ENCOUNTER — Ambulatory Visit: Payer: Medicaid Other | Admitting: Internal Medicine

## 2019-03-27 ENCOUNTER — Ambulatory Visit: Payer: Medicaid Other | Admitting: Internal Medicine

## 2019-04-04 ENCOUNTER — Other Ambulatory Visit: Payer: Self-pay

## 2019-04-04 ENCOUNTER — Encounter: Payer: Self-pay | Admitting: Internal Medicine

## 2019-04-04 ENCOUNTER — Ambulatory Visit: Payer: Medicaid Other | Admitting: Internal Medicine

## 2019-04-04 DIAGNOSIS — F17210 Nicotine dependence, cigarettes, uncomplicated: Secondary | ICD-10-CM | POA: Diagnosis not present

## 2019-04-04 DIAGNOSIS — J449 Chronic obstructive pulmonary disease, unspecified: Secondary | ICD-10-CM | POA: Diagnosis not present

## 2019-04-04 DIAGNOSIS — M7989 Other specified soft tissue disorders: Secondary | ICD-10-CM

## 2019-04-04 NOTE — Patient Instructions (Signed)
The key is to stop smoking completely before smoking completely stops you!  Remember Only use your albuterol as a rescue medication to be used if you can't catch your breath by resting or doing a relaxed purse lip breathing pattern.  - The less you use it, the better it will work when you need it. - Ok to use up to 2 puffs  every 4 hours if you must but call for immediate appointment if use goes up over your usual need - Don't leave home without it !!  (think of it like the spare tire for your car)    Please schedule a follow up visit in 6 months but call sooner if needed

## 2019-04-04 NOTE — Progress Notes (Signed)
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 Pulmonary Consult    Referred by Dr. Bradd Burner. Pt states that she was dxed with COPD in 2009. She states that she gets winded walking short distances such as from room to room at home. She also c/o cough- worse at night and first thing in the am- clear sputum.    uses neb first thing in am then symbicort w/in half an hour which was about 2 h prior to OV   Last pred was nov 2017 ? [facility]  Wakes up congested/ wheezy but no purulent sputum Doe= MMRC3 = can't walk 100 yards even at a slow pace at a flat grade s stopping due to sob  Onset was indolent and pattern is progressive and parallels wt gain also Worse breathing when exp to strong odors/ heat and humidity  rec Plan A = Automatic = Bevespi Take 2 puffs first thing in am and then another 2 puffs about 12 hours later.  Work on inhaler technique:  Plan B = Backup Only use your albuterol (proair) Plan C = Crisis - only use your albuterol nebulizer if you first try Plan B and it fails to help > ok to use the nebulizer up to every 4 hours but if start needing it regularly call for immediate appointment Take Prednisone 10 mg x 4 for three days 3 for three days 2 for three days 1 for three days and stop      03/11/2018  f/u ov/Zamari Vea re: copd III/ group B still smoking  Chief Complaint  Patient presents with  . Follow-up    Cough and SOB are unchanged. She  is using proair 2-3 x per day and duoneb 3-4 x per wk on average.   Dyspnea:  MMRC3 = can't walk 100 yards even at a slow pace at a flat grade s stopping due to sob   Cough: worse in am's thick and white / not using mucinex or flutter as rec  Sleeping: 45 degrees 2-3 am needs rx  SABA use: down some 02: no rec The key is to stop smoking completely before smoking completely stops you!  Change your neb to albuterol (not containing ipatropium) up to every 4 hours if your proair inhaler doesn't relieve you Work on inhaler technique:   For cough > mucinex dm 1200 mg every 12 hours and use flutter as much as possible  Please schedule a follow up visit in 3 months but call sooner if needed     06/27/2018  f/u ov/Debra Page re: GOLD III / group B symptoms  Chief Complaint  Patient presents with  . Follow-up    Breathing is unchanged. She is using her albuterol inhaler 3-4 x per day and neb with albuterol 3-4 x per wk.   Dyspnea:  MMRC3 = can't walk 100 yards even at a slow pace at  a flat grade s stopping due to sob   Cough: not as much of a problem but mucus is  thick / white esp in am >> not using mucinex / flutter as rec  Sleeping: 45 degrees on side  SABA use: as above/ still struggling with hfa  rec Finish your bevespi as we showed you today and if not making progress call before you run out for the same medication in nebulizer (performist and yupelri) The key is to stop smoking completely before smoking completely stops you!  For cough > mucinex   1200 mg every 12 hours and use the flutter valve     10/24/2018  f/u ov/Debra Page re: GOLD III/ still smoking/ maint on bevespi Chief Complaint  Patient presents with  . Follow-up    Breathing is unchanged. She is using her albuterol inhaler 3 x per wk and neb with albuterol 2 x per day on average.    Dyspnea:  MMRC3 = can't walk 100 yards even at a slow pace at a flat grade s stopping due to sob   Cough: usually at hs min mucoid Sleeping:  uncomfortable due to back problems  SABA use: as above  rec Plan A = Automatic =  Bevespi  =  Yupelri once  each am and  performist twice daily  Plan B = Backup Only use your albuterol Proair) inhaler  Plan C = Crisis - only use your albuterol nebulizer if you first try Plan B and it fails to help > ok to use the nebulizer up to every 4 hours but if start needing it regularly call for immediate appointment   04/04/2019  f/u ov/Debra Page re: copd III/ still smoking  Chief Complaint  Patient presents with  . Follow-up    Breathing is doing better. She is using her albuterol 3 x per wk on average and albuterol neb 2 x per day.  Dyspnea:   Doe room to room  Cough: none  Sleeping: sitting up due to back  SABA use: as above, usually when over does it  02: none  New bilateral swelling >cards w/u req by PCP   No obvious day to day or daytime variability or assoc excess/ purulent sputum or mucus plugs or hemoptysis or cp or chest tightness, subjective wheeze or overt sinus or hb symptoms.   Sleeping as above due to back  without nocturnal  or early am exacerbation  of respiratory  c/o's or need for noct saba. Also denies any obvious fluctuation of symptoms with weather or environmental changes or other aggravating or alleviating factors except as outlined above   No unusual exposure hx or h/o childhood pna/ asthma or knowledge of premature birth.  Current Allergies, Complete Past Medical History, Past Surgical History, Family History, and Social History were reviewed in [facility] electronic medical record.  ROS  The following are not active complaints unless bolded Hoarseness, sore throat, dysphagia, dental problems, itching, sneezing,  nasal congestion or discharge of excess mucus or purulent secretions, ear ache,   fever, chills, sweats, unintended wt loss or wt gain, classically pleuritic or exertional cp,  orthopnea pnd or arm/hand swelling  or leg swelling, presyncope, palpitations,  abdominal pain, anorexia, nausea, vomiting, diarrhea  or change in bowel habits or change in bladder habits, change in stools or change in urine, dysuria, hematuria,  rash, arthralgias, visual complaints, headache, numbness, weakness or ataxia or problems with walking or coordination,  change in mood or  memory.  Current Meds  Medication Sig  . albuterol (PROVENTIL) (2.5 MG/3ML) 0.083% nebulizer solution INHALE CONTENTS OF 1 VIAL IN NEBULIZER EVERY 6 HOURS AS NEEDED FOR WHEEZING OR SHORTNESS OF BREATH  . albuterol (VENTOLIN HFA) 108 (90 Base) MCG/ACT inhaler INHALE 1-2 PUFFS EVERY 4 TO 6 HOURS AS NEEDED FOR RESCUE  . Cetirizine HCl (ZYRTEC ALLERGY) 10 MG CAPS Take 1 capsule (10 mg total) by mouth daily.  . formoterol (PERFOROMIST) 20 MCG/2ML nebulizer solution Take 2 mLs (20 mcg total) by nebulization 2 (two) times daily.  Marland Kitchen. ibuprofen (ADVIL) 200 MG tablet Take 200 mg by mouth every 6 (six) hours as needed.  Marland Kitchen. levothyroxine (SYNTHROID, LEVOTHROID) 100 MCG tablet Take 100 mcg by mouth daily.  Marland Kitchen. oxybutynin (DITROPAN) 5 MG tablet Take 5 mg by mouth 3 (three) times daily.  . pantoprazole (PROTONIX) 40 MG tablet TAKE ONE TABLET BY MOUTH 30-60 MINUTES BEFORE THE FIRST MEAL OF THE DAY.  Marland Kitchen. PAZEO 0.7 % SOLN As directed  . Respiratory Therapy Supplies (FLUTTER) DEVI 1 Device by Does not apply route as needed.  . RESTASIS 0.05 % ophthalmic emulsion Apply 1 drop to eye 2 (two) times daily.  . Revefenacin (YUPELRI) 175 MCG/3ML SOLN Inhale 1 vial into the lungs daily.  . simvastatin (ZOCOR) 20 MG tablet Take 20 mg by mouth at bedtime.                 Objective:   Physical Exam    Obese wf in w/c due to knee pain    04/04/2019      234 10/24/2018        224  06/27/2018      225  03/11/2018      224  10/18/2017      220  08/27/2017         214 07/19/2017     212  06/02/2017    210   04/20/17 207 lb 6.4 oz (94.1 kg)  01/23/12 187 lb (84.8 kg)      Vital signs reviewed - Note on arrival 02  sats  96% on RA        HEENT: nl dentition / oropharynx. Nl external ear canals without cough reflex -  Mild bilateral non-specific turbinate edema     NECK :  without JVD/Nodes/TM/ nl carotid upstrokes bilaterally   LUNGS: no acc muscle use,  Mod barrel  contour chest wall with bilateral  Distant bs s audible wheeze and  without cough on insp or exp maneuver and mod  Hyperresonant  to  percussion bilaterally     CV:  RRR  no s3 or murmur or increase in P2, and 1+ pitting edema sym both ankles   ABD:  Obese, soft and nontender with poor insp excursion. No bruits or organomegaly appreciated, bowel sounds nl  MS:     ext warm without deformities, calf tenderness, cyanosis or clubbing No obvious joint restrictions   SKIN: warm and dry without lesions    NEURO:  alert, approp, nl sensorium with  no motor or cerebellar deficits apparent.            Assessment:

## 2019-04-05 ENCOUNTER — Encounter: Payer: Self-pay | Admitting: Internal Medicine

## 2019-04-05 DIAGNOSIS — M7989 Other specified soft tissue disorders: Secondary | ICD-10-CM | POA: Insufficient documentation

## 2019-04-05 NOTE — Assessment & Plan Note (Signed)
New onset 01/2019 > cards w/u planned for 04/10/19  I encouraged her to keep this appointment because it may well be she is developing cor pulmonale and if so one of the first steps in the work-up should be probably to do a overnight pulse oximetry to make sure she is not desaturating but since she is already scheduled to see cardiology I will defer this work-up for now from a pulmonary perspective.   Each maintenance medication was reviewed in detail including most importantly the difference between maintenance and as needed and under what circumstances the prns are to be used.  Please see AVS for specific  Instructions which are unique to this visit and I personally typed out  which were reviewed in detail in writing with the patient and a copy provided.

## 2019-04-05 NOTE — Assessment & Plan Note (Addendum)
4-5 min discussion re active cigarette smoking in addition to office E&M  Ask about tobacco use:   Ongoing  Advise quitting   I emphasized that although we never turn away smokers from the pulmonary clinic, we do ask that they understand that the recommendations that we make  won't work nearly as well in the presence of continued cigarette exposure. In fact, we may very well  reach a point where we can't promise to help the patient if he/she can't quit smoking. (We can and will promise to try to help, we just can't promise what we recommend will really work)  Assess willingness:  Not committed at this point Assist in quit attempt:  Per PCP when ready Arrange follow up:   Follow up per Primary Care planned  For smoking cessation classes call [phone]

## 2019-04-05 NOTE — Assessment & Plan Note (Addendum)
Active smoker Spirometry 04/20/2017  FEV1 0.94 (39%)  Ratio 56 p am symb/duoneb w/in 2 h - 04/20/2017   Walked RA x one lap @ 185 stopped due to  Sob/ fast pace, no desat - 04/20/2017  After extensive coaching HFA effectiveness =    50%  > try bevespi 2bid  No better  - PFT's  06/02/2017  FEV1 0.79  (33 % ) ratio 45  p no % improvement from saba p symbicort x 2 pffs  prior to study with DLCO  66/66c % corrects to 86  % for alv volume   - added flutter valve 06/02/2017\ - [facility] Screening 06/02/17  MM  Level 158 - 06/02/2017    try spiriva smi 2 qam and gerd rx > no better / thrush on symbiocrt 160 @ ov 07/19/2017  - 07/19/2017    changed back to bevespi 2 bid  - 03/11/2018   Walked RA  2 laps @ 185 ft each stopped due to  Sob with sats ok/ relatively slow pace  - 10/24/2018  After extensive coaching inhaler device,  effectiveness =    50% at most due to very short Ti so rec change  to [place] and Perforomist in nebulizer form  Pt is Group B in terms of symptom/risk and laba/lama therefore appropriate rx at this point >>>  Continue yupelri and performist    I spent extra time with pt today reviewing appropriate use of albuterol for prn use on exertion with the following points: 1) saba is for relief of sob that does not improve by walking a slower pace or resting but rather if the pt does not improve after trying this first. 2) If the pt is convinced, as many are, that saba helps recover from activity faster then it's easy to tell if this is the case by re-challenging : ie stop, take the inhaler, then p 5 minutes try the exact same activity (intensity of workload) that just caused the symptoms and see if they are substantially diminished or not after saba 3) if there is an activity that reproducibly causes the symptoms, try the saba 15 min before the activity on alternate days   If in fact the saba really does help, then fine to continue to use it prn but advised may need to look closer at the  maintenance regimen being used to achieve better control of airways disease with exertion.

## 2019-04-10 ENCOUNTER — Ambulatory Visit: Payer: Medicaid Other | Admitting: Cardiovascular Disease

## 2019-04-17 ENCOUNTER — Other Ambulatory Visit: Payer: Self-pay | Admitting: Internal Medicine

## 2019-05-02 ENCOUNTER — Other Ambulatory Visit: Payer: Self-pay | Admitting: Internal Medicine

## 2019-05-12 ENCOUNTER — Other Ambulatory Visit: Payer: Self-pay | Admitting: Internal Medicine

## 2019-05-18 ENCOUNTER — Telehealth: Payer: Self-pay | Admitting: Cardiovascular Disease

## 2019-05-18 NOTE — Telephone Encounter (Signed)

## 2019-05-19 ENCOUNTER — Ambulatory Visit (INDEPENDENT_AMBULATORY_CARE_PROVIDER_SITE_OTHER): Payer: Medicaid Other | Admitting: Cardiovascular Disease

## 2019-05-19 ENCOUNTER — Encounter: Payer: Self-pay | Admitting: *Deleted

## 2019-05-19 ENCOUNTER — Other Ambulatory Visit: Payer: Self-pay

## 2019-05-19 ENCOUNTER — Encounter: Payer: Self-pay | Admitting: Cardiovascular Disease

## 2019-05-19 ENCOUNTER — Telehealth: Payer: Self-pay | Admitting: Cardiovascular Disease

## 2019-05-19 VITALS — BP 108/75 | HR 72 | Ht 60.0 in | Wt 237.8 lb

## 2019-05-19 DIAGNOSIS — R0609 Other forms of dyspnea: Secondary | ICD-10-CM

## 2019-05-19 DIAGNOSIS — Z720 Tobacco use: Secondary | ICD-10-CM

## 2019-05-19 DIAGNOSIS — R0602 Shortness of breath: Secondary | ICD-10-CM

## 2019-05-19 DIAGNOSIS — R600 Localized edema: Secondary | ICD-10-CM | POA: Diagnosis not present

## 2019-05-19 DIAGNOSIS — J449 Chronic obstructive pulmonary disease, unspecified: Secondary | ICD-10-CM

## 2019-05-19 NOTE — Telephone Encounter (Signed)
°  Precert needed for:  [facility] WITH CONTRAST [place]   Location: [place]    Date: Jun 02, 2019

## 2019-05-19 NOTE — Progress Notes (Signed)
CARDIOLOGY CONSULT NOTE  Patient ID: Debra Page MRN: [id number] DOB/AGE: 1966-01-07 53 y.o.  Admit date: (Not on file) Primary Physician: [facility] Referring Physician: Tylene Fantasia., PA-C  Reason for Consultation: Bilateral leg edema and shortness of breath  HPI: Debra Page is a 53 y.o. female who is being seen today for the evaluation of bilateral leg edema and shortness of breath at the request of Tylene Fantasia., PA-C.   I reviewed notes from her PCP.  She has a history of COPD and longstanding tobacco abuse.  She also has hyperlipidemia and hypothyroidism.  I reviewed labs which included a normal BNP of 11 on 02/17/2019.  Additional labs performed on 01/25/2019 include total cholesterol 151, HDL 53, triglycerides 83, LDL 81, sodium 141, and normal creatinine.  She follows with pulmonary in [facility] for her COPD.  ECG performed in the office today which I ordered and personally interpreted demonstrates normal sinus rhythm with no ischemic ST segment or T-wave abnormalities, nor any arrhythmias.  She has been smoking for 40 years.  She now smokes 1/2 pack of cigarettes daily but had been smoking up to a pack of cigarettes daily.  She denies exertional chest pain.  She has chronic exertional dyspnea which has been stable over the past 2 years.  She does not use oxygen.  She has awoken with shortness of breath.  She has occasional palpitations.  Leg swelling has been bothering her for the past 6 months.  She does elevate her legs at home.    No Known Allergies  Current Outpatient Medications  Medication Sig Dispense Refill  . albuterol (PROVENTIL) (2.5 MG/3ML) 0.083% nebulizer solution INHALE CONTENTS OF 1 VIAL IN NEBULIZER EVERY 6 HOURS AS NEEDED FOR WHEEZING OR SHORTNESS OF BREATH 90 mL 1  . albuterol (VENTOLIN HFA) 108 (90 Base) MCG/ACT inhaler INHALE 1-2 PUFFS EVERY 4 TO 6 HOURS AS NEEDED FOR RESCUE 8.5 g 1  . Cetirizine HCl  (ZYRTEC ALLERGY) 10 MG CAPS Take 1 capsule (10 mg total) by mouth daily. 30 capsule 11  . formoterol (PERFOROMIST) 20 MCG/2ML nebulizer solution Take 2 mLs (20 mcg total) by nebulization 2 (two) times daily. 120 mL 11  . ibuprofen (ADVIL) 200 MG tablet Take 200 mg by mouth every 6 (six) hours as needed.    Marland Kitchen levothyroxine (SYNTHROID, LEVOTHROID) 100 MCG tablet Take 100 mcg by mouth daily.    Marland Kitchen oxybutynin (DITROPAN) 5 MG tablet Take 5 mg by mouth 3 (three) times daily.    . pantoprazole (PROTONIX) 40 MG tablet TAKE ONE TABLET BY MOUTH 30-60 MINUTES BEFORE THE FIRST MEAL OF THE DAY. 30 tablet 5  . PAZEO 0.7 % SOLN As directed  6  . Respiratory Therapy Supplies (FLUTTER) DEVI 1 Device by Does not apply route as needed. 1 each 0  . RESTASIS 0.05 % ophthalmic emulsion Apply 1 drop to eye 2 (two) times daily.  3  . Revefenacin (YUPELRI) 175 MCG/3ML SOLN Inhale 1 vial into the lungs daily. 90 mL 11  . simvastatin (ZOCOR) 20 MG tablet Take 20 mg by mouth at bedtime.  6   No current facility-administered medications for this visit.     Past Medical History:  Diagnosis Date  . Abscess   . Bronchitis   . COPD (chronic obstructive pulmonary disease) (HCC)   . Thyroid disease     Past Surgical History:  Procedure Laterality Date  . MOUTH SURGERY  Social History   Socioeconomic History  . Marital status: Single    Spouse name: Not on file  . Number of children: Not on file  . Years of education: Not on file  . Highest education level: Not on file  Occupational History  . Not on file  Social Needs  . Financial resource strain: Not on file  . Food insecurity    Worry: Not on file    Inability: Not on file  . Transportation needs    Medical: Not on file    Non-medical: Not on file  Tobacco Use  . Smoking status: Current Every Day Smoker    Packs/day: 0.50    Years: 40.00    Pack years: 20.00    Types: Cigarettes    Start date: 03/22/1979  . Smokeless tobacco: Never Used   Substance and Sexual Activity  . Alcohol use: No  . Drug use: No  . Sexual activity: Never  Lifestyle  . Physical activity    Days per week: Not on file    Minutes per session: Not on file  . Stress: Not on file  Relationships  . Social Herbalist on phone: Not on file    Gets together: Not on file    Attends religious service: Not on file    Active member of club or organization: Not on file    Attends meetings of clubs or organizations: Not on file    Relationship status: Not on file  . Intimate partner violence    Fear of current or ex partner: Not on file    Emotionally abused: Not on file    Physically abused: Not on file    Forced sexual activity: Not on file  Other Topics Concern  . Not on file  Social History Narrative  . Not on file     No family history of premature CAD in 1st degree relatives.  No outpatient medications have been marked as taking for the 05/19/19 encounter (Office Visit) with Herminio Commons, MD.      Review of systems complete and found to be negative unless listed above in HPI    Physical exam Height 5' (1.524 m), weight 237 lb 12.8 oz (107.9 kg). General: NAD, obese female Neck: No JVD, no thyromegaly or thyroid nodule.  Lungs: Diffusely diminished breath sounds with poor air movement, no crackles or wheezes CV: Nondisplaced PMI. Regular rate and rhythm, normal S1/S2, no S3/S4, no murmur.  1+ pitting peri-ankle edema, trace bilateral pretibial edema.  No carotid bruit.    Abdomen: Soft, nontender, obese Skin: Intact without lesions or rashes.  Neurologic: Alert and oriented x 3.  Psych: Normal affect. Extremities: No clubbing or cyanosis.  HEENT: Normal.   ECG: Most recent ECG reviewed.   Labs: Lab Results  Component Value Date/Time   K 4.0 06/02/2017 02:48 PM   BUN 10 06/02/2017 02:48 PM   CREATININE 0.74 06/02/2017 02:48 PM   HGB 14.8 06/02/2017 02:48 PM     Lipids: No results found for: LDLCALC,  LDLDIRECT, CHOL, TRIG, HDL      ASSESSMENT AND PLAN:  1.  Dyspnea on exertion: This is likely due to severe COPD due to longstanding tobacco abuse.  That being said in order to rule out occult ischemic heart disease, I will obtain a Lexiscan Myoview.  2.  Bilateral leg edema: I will order a 2-D echocardiogram with Doppler to evaluate cardiac structure, function, and regional wall motion (with contrast given  her significant COPD and probable poor quality images).  3.  Tobacco abuse: Currently smoking half pack of cigarettes daily but has smoked up to a pack of cigarettes daily and has been smoking for over 40 years.  4.  COPD: She has severe COPD from longstanding tobacco abuse.  She follows with pulmonary in [place].    Disposition: Follow up in 3 months  Signed: Prentice DockerSuresh Boyce Keltner, M.D., F.A.C.C.  05/19/2019, 10:30 AM

## 2019-05-19 NOTE — Patient Instructions (Signed)
Your physician recommends that you schedule a follow-up appointment in: 3 MONTHS WITH DR KONESWARAN  Your physician recommends that you continue on your current medications as directed. Please refer to the Current Medication list given to you today.  Your physician has requested that you have an echocardiogram. Echocardiography is a painless test that uses sound waves to create images of your heart. It provides your doctor with information about the size and shape of your heart and how well your heart's chambers and valves are working. This procedure takes approximately one hour. There are no restrictions for this procedure.  Your physician has requested that you have a lexiscan myoview. For further information please visit [email or web address]. Please follow instruction sheet, as given.  Thank you for choosing [facility]!!    

## 2019-06-02 ENCOUNTER — Ambulatory Visit (HOSPITAL_BASED_OUTPATIENT_CLINIC_OR_DEPARTMENT_OTHER)
Admission: RE | Admit: 2019-06-02 | Discharge: 2019-06-02 | Disposition: A | Discharge status: Home / Self Care | Payer: Medicaid Other | Source: Ambulatory Visit | Attending: Cardiovascular Disease | Admitting: Cardiovascular Disease

## 2019-06-02 ENCOUNTER — Ambulatory Visit (HOSPITAL_COMMUNITY)
Admission: RE | Admit: 2019-06-02 | Discharge: 2019-06-02 | Disposition: A | Discharge status: Home / Self Care | Payer: Medicaid Other | Source: Ambulatory Visit | Attending: Cardiovascular Disease | Admitting: Cardiovascular Disease

## 2019-06-02 ENCOUNTER — Encounter (HOSPITAL_COMMUNITY): Payer: Self-pay

## 2019-06-02 ENCOUNTER — Other Ambulatory Visit: Payer: Self-pay

## 2019-06-02 ENCOUNTER — Encounter (HOSPITAL_COMMUNITY)
Admission: RE | Admit: 2019-06-02 | Discharge: 2019-06-02 | Disposition: A | Discharge status: Home / Self Care | Payer: Medicaid Other | Source: Ambulatory Visit | Attending: Cardiovascular Disease | Admitting: Cardiovascular Disease

## 2019-06-02 DIAGNOSIS — R0600 Dyspnea, unspecified: Secondary | ICD-10-CM

## 2019-06-02 DIAGNOSIS — R0609 Other forms of dyspnea: Secondary | ICD-10-CM

## 2019-06-02 DIAGNOSIS — R0602 Shortness of breath: Secondary | ICD-10-CM | POA: Insufficient documentation

## 2019-06-02 HISTORY — DX: Unspecified asthma, uncomplicated: J45.909

## 2019-06-02 LAB — NM MYOCAR MULTI W/SPECT W/WALL MOTION / EF
LV dias vol: 57 mL (ref 46–106)
LV sys vol: 17 mL
Peak HR: 106 {beats}/min
RATE: 0.37
Rest HR: 82 {beats}/min
SDS: 5
SRS: 0
SSS: 5
TID: 1.02

## 2019-06-02 MED ORDER — SODIUM CHLORIDE 0.9% FLUSH
INTRAVENOUS | Status: AC
  Administered 2019-06-02: 10 mL via INTRAVENOUS
  Filled 2019-06-02: qty 10

## 2019-06-02 MED ORDER — TECHNETIUM TC 99M TETROFOSMIN IV KIT
10.0000 | PACK | Freq: Once | INTRAVENOUS | Status: AC | PRN
  Administered 2019-06-02: 9.98 via INTRAVENOUS

## 2019-06-02 MED ORDER — TECHNETIUM TC 99M TETROFOSMIN IV KIT
30.0000 | PACK | Freq: Once | INTRAVENOUS | Status: AC
  Administered 2019-06-02: 29.5 via INTRAVENOUS

## 2019-06-02 MED ORDER — REGADENOSON 0.4 MG/5ML IV SOLN
INTRAVENOUS | Status: AC
  Administered 2019-06-02: 0.4 mg via INTRAVENOUS
  Filled 2019-06-02: qty 5

## 2019-06-02 NOTE — Progress Notes (Signed)
*  PRELIMINARY RESULTS* Echocardiogram 2D Echocardiogram has been performed.  Debra Page 06/02/2019, 2:09 PM

## 2019-06-05 ENCOUNTER — Other Ambulatory Visit: Payer: Self-pay | Admitting: Internal Medicine

## 2019-06-21 ENCOUNTER — Other Ambulatory Visit: Payer: Self-pay | Admitting: Internal Medicine

## 2019-07-06 ENCOUNTER — Telehealth: Payer: Self-pay | Admitting: Internal Medicine

## 2019-07-06 NOTE — Telephone Encounter (Signed)
Rx for albuterol neb solution last ordered 06/05/2019 with 1 refill.  Advised pt to contact [facility], as they should have a refill on file.  Pt voiced her understanding.  Nothing further is needed.

## 2019-07-07 ENCOUNTER — Telehealth: Payer: Self-pay | Admitting: Internal Medicine

## 2019-07-07 MED ORDER — YUPELRI 175 MCG/3ML IN SOLN: 1.0000 | Freq: Every day | RESPIRATORY_TRACT | 11 refills | Status: DC

## 2019-07-07 MED ORDER — PERFOROMIST 20 MCG/2ML IN NEBU: 20.0000 ug | INHALATION_SOLUTION | Freq: Two times a day (BID) | RESPIRATORY_TRACT | 11 refills | Status: DC

## 2019-07-07 MED ORDER — ALBUTEROL SULFATE (2.5 MG/3ML) 0.083% IN NEBU: 2.5000 mg | INHALATION_SOLUTION | Freq: Four times a day (QID) | RESPIRATORY_TRACT | 5 refills | Status: DC | PRN

## 2019-07-07 NOTE — Telephone Encounter (Signed)
Refill of all three neb sol has been sent to pt's preferred pharmacy. Attempted to call pt to let her know the Rx's were sent to pharmacy for her but unable to reach. Left detailed message letting her know this had been done. Nothing further needed.

## 2019-08-14 ENCOUNTER — Ambulatory Visit: Payer: Medicaid Other | Admitting: Cardiovascular Disease

## 2019-08-21 ENCOUNTER — Other Ambulatory Visit: Payer: Self-pay | Admitting: Internal Medicine

## 2019-09-12 IMAGING — CT CT MAXILLOFACIAL W/O CM
3 series · 15 of 47 positions shown, 18 images · non-contrast
Comparison: None.

CLINICAL DATA: 51-year-old female with persistent productive cough
for 2-3 months. Initial encounter.

EXAM:
CT MAXILLOFACIAL WITHOUT CONTRAST
TECHNIQUE: Multidetector CT images of the paranasal sinuses were obtained using
the standard protocol without intravenous contrast.

[Series 2: max soft · axial · 0.35mm/px · z∈[-78,+76]mm · 9 of 91 slices shown, 12 images]
[im 7/91  brain]
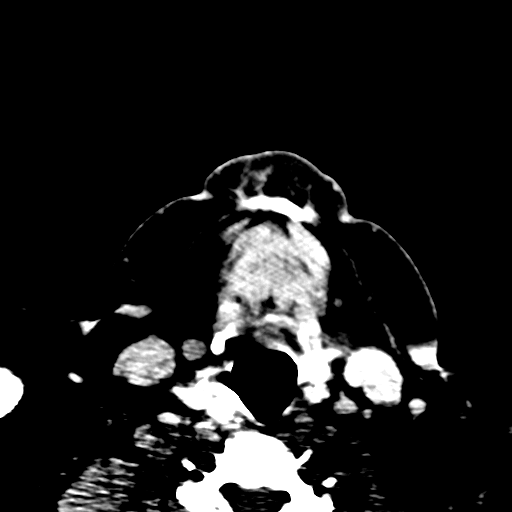
[im 7/91  bone]
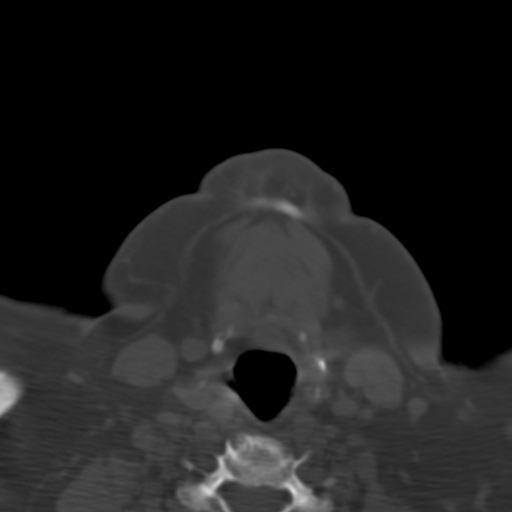
[im 16/91  bone]
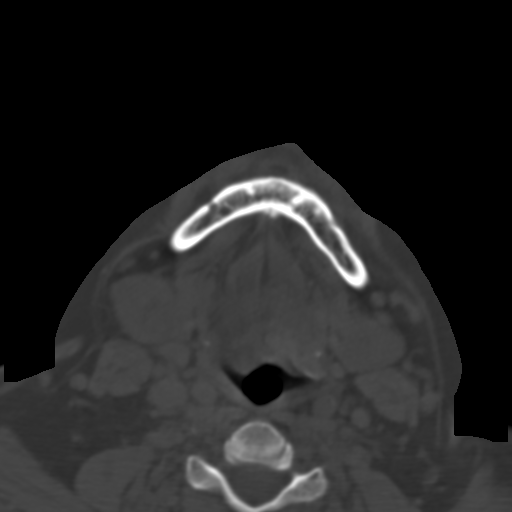
[im 25/91  bone]
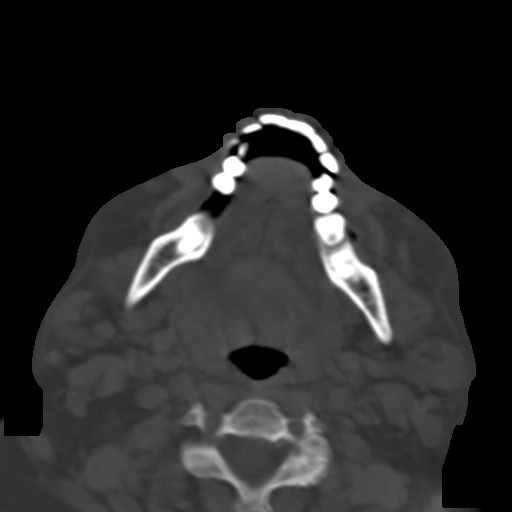
[im 35/91  bone]
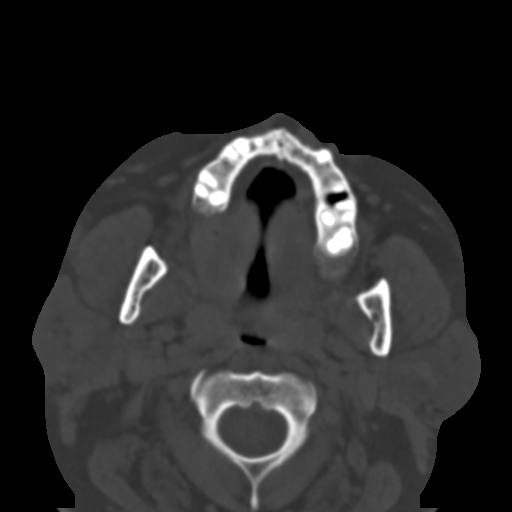
[im 47/91  brain]
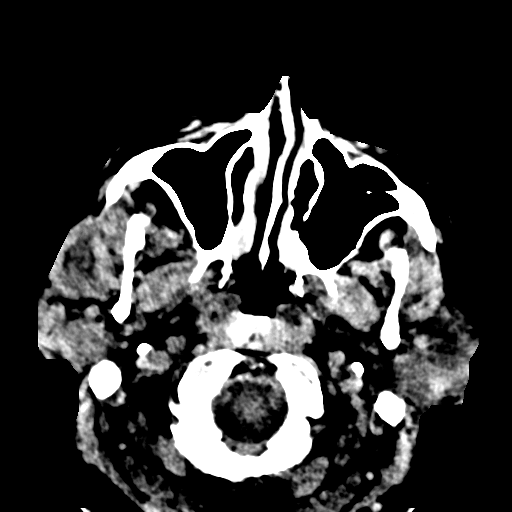
[im 47/91  bone]
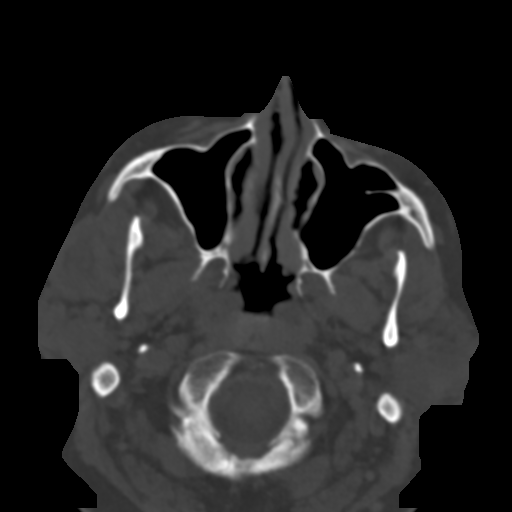
[im 56/91  bone]
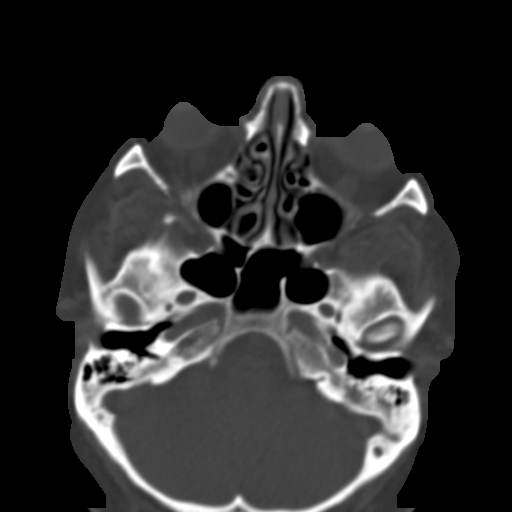
[im 66/91  bone]
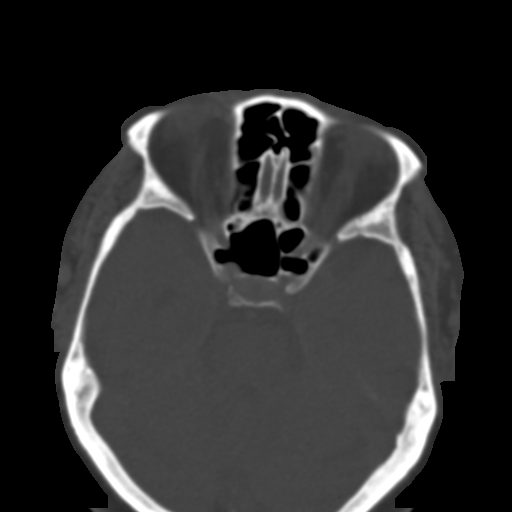
[im 75/91  bone]
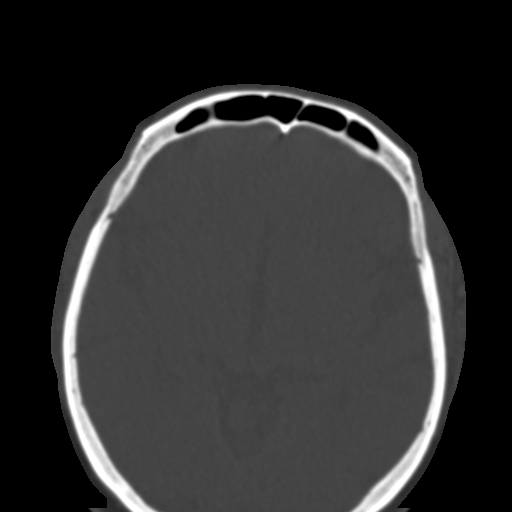
[im 84/91  brain]
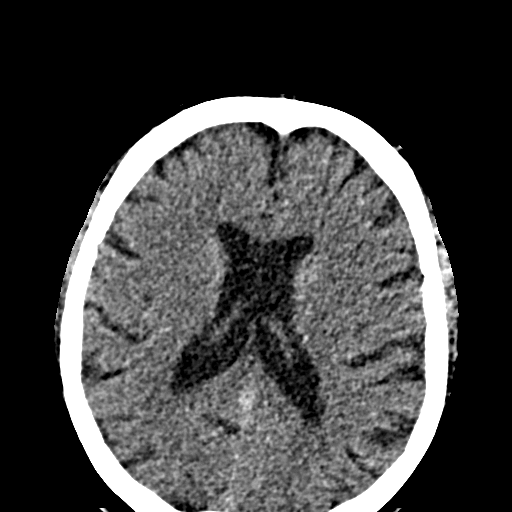
[im 84/91  bone]
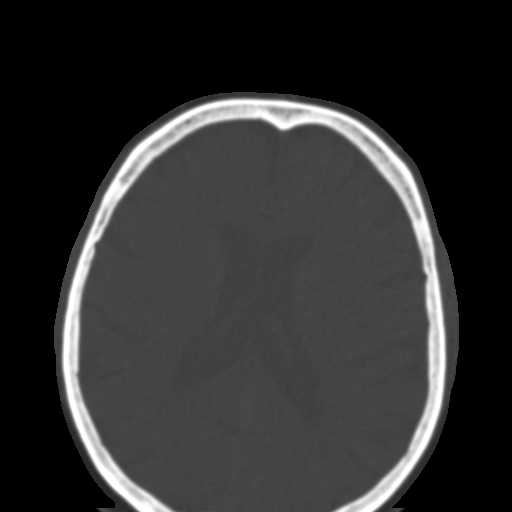

[Series 6: coronal soft · coronal · 0.33mm/px · 3 of 72 slices shown]
[im 24/72  bone]
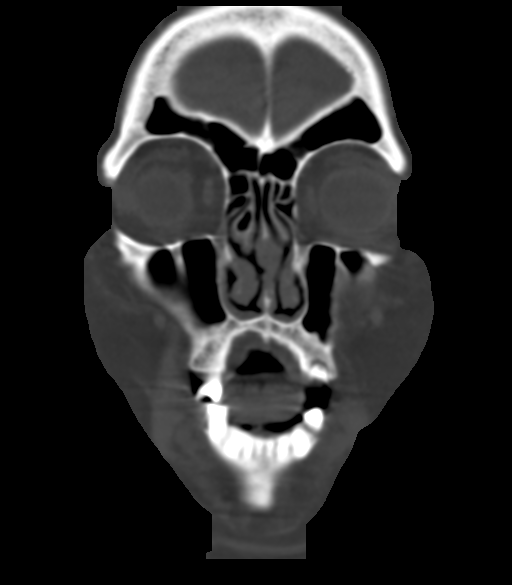
[im 32/72  bone]
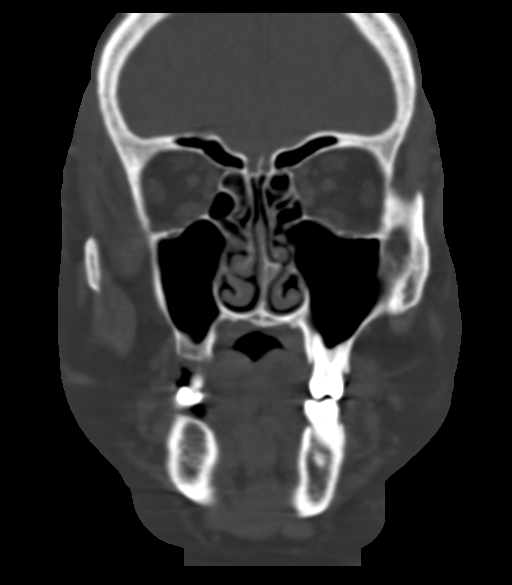
[im 40/72  bone]
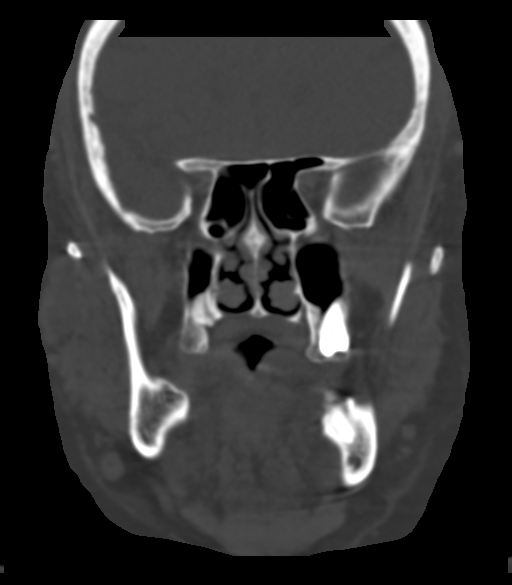

[Series 7: sagittal soft · sagittal · 0.30mm/px · 3 of 85 slices shown]
[im 29/85  bone]
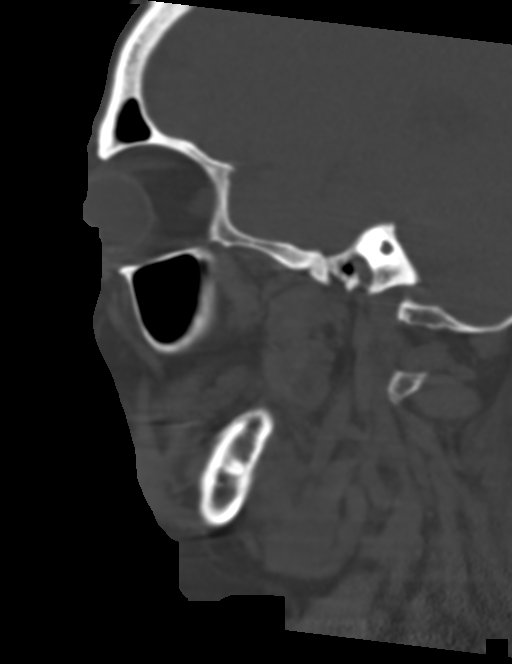
[im 43/85  bone]
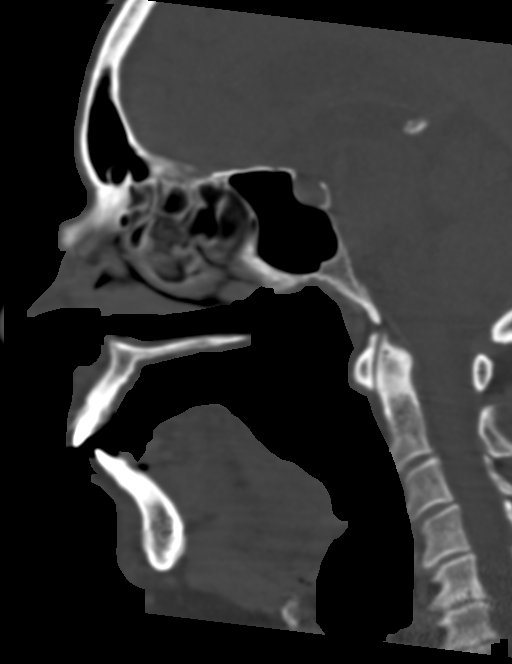
[im 57/85  bone]
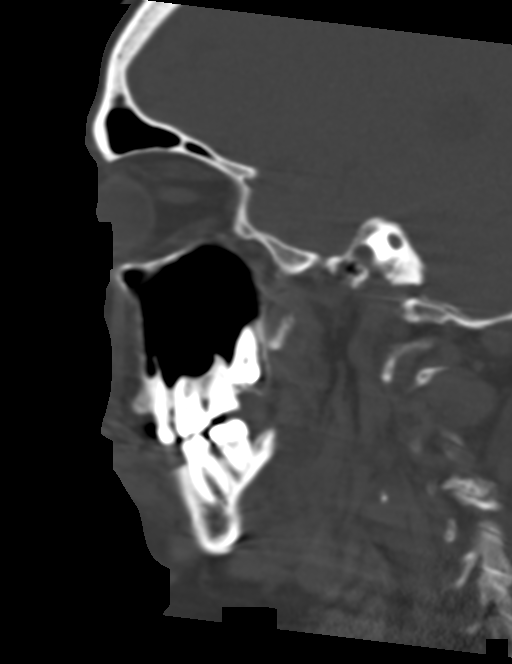

[15 of 47 positions shown; findings below may reference images not displayed]

FINDINGS: Paranasal sinuses:

Frontal: Normally aerated. Patent frontal sinus drainage pathways.

Ethmoid: Normally aerated.

Maxillary: Normally aerated.

Sphenoid: Normally aerated. Patent sphenoethmoidal recesses.

Right ostiomeatal unit: Mucosal thickening with narrowing.

Left ostiomeatal unit: Patent.

Nasal passages: Mild concha bullosa right middle turbinate. Intact
nasal septum is midline.

Anatomy: Optic nerves course through the superolateral aspect of the
sphenoid sinus bilaterally with thin or dehiscent bony cover.
Carotid arteries form portion of the posterolateral aspect of the
sphenoid sinus bilaterally. Keros 2 bilaterally

Other: 9 x 8 x 10 mm pedunculated nonspecific dermal lesion lateral
to the anterior inferior margin of the left parotid gland.

Mild exophthalmos.  Symmetric extra-ocular muscles.

Carotid bifurcation calcifications.

Caries.  Intracranial mild atrophy.

Slightly prominent adenoidal tissue. Mastoid air cells and middle
ear cavities are clear.
IMPRESSION: Clear paranasal sinuses.

Mild mucosal thickening slight narrowing of the infundibulum of the
right ostiomeatal complex.

Bony landmarks as noted above.

Caries.

Mild carotid bifurcation calcification.

Nonspecific 1 cm dermal lesion arising from the left lateral cheek.

## 2019-09-21 ENCOUNTER — Other Ambulatory Visit: Payer: Self-pay | Admitting: Internal Medicine

## 2019-10-06 ENCOUNTER — Ambulatory Visit: Payer: Medicaid Other | Admitting: Internal Medicine

## 2019-10-10 ENCOUNTER — Ambulatory Visit: Payer: Medicaid Other | Admitting: Internal Medicine

## 2019-10-10 ENCOUNTER — Other Ambulatory Visit: Payer: Self-pay | Admitting: Internal Medicine

## 2019-10-17 ENCOUNTER — Other Ambulatory Visit: Payer: Self-pay | Admitting: Internal Medicine

## 2019-10-27 ENCOUNTER — Ambulatory Visit: Payer: Medicaid Other | Admitting: Internal Medicine

## 2019-11-02 ENCOUNTER — Telehealth: Payer: Self-pay | Admitting: Internal Medicine

## 2019-11-03 NOTE — Telephone Encounter (Signed)
Pt requesting PA on Yuplri and Perforomist. Called [employer], PA for Perforomist and Yupelri have both been approved from 11/03/2019 - 10/28/2020.   perforomist PA #: [id number] yupelri PA #: [id number]  Spoke with pharmacy, rx's were processed appropriately. Spoke to pt, aware of approval.   Nothing further needed at this time- will close encounter.

## 2019-11-07 ENCOUNTER — Other Ambulatory Visit: Payer: Self-pay

## 2019-11-07 ENCOUNTER — Ambulatory Visit (INDEPENDENT_AMBULATORY_CARE_PROVIDER_SITE_OTHER): Payer: Medicaid Other | Admitting: Internal Medicine

## 2019-11-07 ENCOUNTER — Encounter: Payer: Self-pay | Admitting: Internal Medicine

## 2019-11-07 DIAGNOSIS — J449 Chronic obstructive pulmonary disease, unspecified: Secondary | ICD-10-CM | POA: Diagnosis not present

## 2019-11-07 DIAGNOSIS — F17210 Nicotine dependence, cigarettes, uncomplicated: Secondary | ICD-10-CM | POA: Diagnosis not present

## 2019-11-07 NOTE — Patient Instructions (Signed)
The key is to stop smoking completely before smoking completely stops you!  Try albuterol nebulizer 15 min before an activity that you know would make you short of breath and see if it makes any difference and if makes none then don't take it after activity unless you can't catch your breath.   Please schedule a follow up visit in 6 months but call sooner if needed  with all medications /inhalers/ solutions in hand so we can verify exactly what you are taking. This includes all medications from all doctors and over the counters  -  [place] office

## 2019-11-07 NOTE — Assessment & Plan Note (Signed)
Active smoker Spirometry 04/20/2017  FEV1 0.94 (39%)  Ratio 56 p am symb/duoneb w/in 2 h - 04/20/2017   Walked RA x one lap @ 185 stopped due to  Sob/ fast pace, no desat - 04/20/2017  After extensive coaching HFA effectiveness =    50%  > try bevespi 2bid  No better  - PFT's  06/02/2017  FEV1 0.79  (33 % ) ratio 45  p no % improvement from saba p symbicort x 2 pffs  prior to study with DLCO  66/66c % corrects to 86  % for alv volume   - added flutter valve 06/02/2017\ - [facility] Screening 06/02/17  MM  Level 158 - 06/02/2017    try spiriva smi 2 qam and gerd rx > no better / thrush on symbiocrt 160 @ ov 07/19/2017  - 07/19/2017    changed back to bevespi 2 bid  - 03/11/2018   Walked RA  2 laps @ 185 ft each stopped due to  Sob with sats ok/ relatively slow pace  - 10/24/2018  After extensive coaching inhaler device,  effectiveness =    50% at most due to very short Ti so rec change  to [place] and Perforomist in nebulizer form  Pt is Group B in terms of symptom/risk and laba/lama therefore appropriate rx at this point >>>  Continue performist/ yupelri and prn saba  I spent extra time with pt today reviewing appropriate use of albuterol for prn use on exertion with the following points: 1) saba is for relief of sob that does not improve by walking a slower pace or resting but rather if the pt does not improve after trying this first. 2) If the pt is convinced, as many are, that saba helps recover from activity faster then it's easy to tell if this is the case by re-challenging : ie stop, take the inhaler, then p 5 minutes try the exact same activity (intensity of workload) that just caused the symptoms and see if they are substantially diminished or not after saba 3) if there is an activity that reproducibly causes the symptoms, try the saba 15 min before the activity on alternate days   If in fact the saba really does help, then fine to continue to use it prn but advised may need to look closer at the  maintenance regimen being used to achieve better control of airways disease with exertion.

## 2019-11-07 NOTE — Progress Notes (Signed)
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 Pulmonary Consult    Referred by Dr. Bradd Burner. Pt states that she was dxed with COPD in 2009. She states that she gets winded walking short distances such as from room to room at home. She also c/o cough- worse at night and first thing in the am- clear sputum.    uses neb first thing in am then symbicort w/in half an hour which was about 2 h prior to OV   Last pred was nov 2017 ? [facility]  Wakes up congested/ wheezy but no purulent sputum Doe= MMRC3 = can't walk 100 yards even at a slow pace at a flat grade s stopping due to sob  Onset was indolent and pattern is progressive and parallels wt gain also Worse breathing when exp to strong odors/ heat and humidity  rec Plan A = Automatic = Bevespi Take 2 puffs first thing in am and then another 2 puffs about 12 hours later.  Work on inhaler technique:  Plan B = Backup Only use your albuterol (proair) Plan C = Crisis - only use your albuterol nebulizer if you first try Plan B and it fails to help > ok to use the nebulizer up to every 4 hours but if start needing it regularly call for immediate appointment Take Prednisone 10 mg x 4 for three days 3 for three days 2 for three days 1 for three days and stop      03/11/2018  f/u ov/Debra Page re: copd III/ group B still smoking  Chief Complaint  Patient presents with  . Follow-up    Cough and SOB are unchanged. She  is using proair 2-3 x per day and duoneb 3-4 x per wk on average.   Dyspnea:  MMRC3 = can't walk 100 yards even at a slow pace at a flat grade s stopping due to sob   Cough: worse in am's thick and white / not using mucinex or flutter as rec  Sleeping: 45 degrees 2-3 am needs rx  SABA use: down some 02: no rec The key is to stop smoking completely before smoking completely stops you!  Change your neb to albuterol (not containing ipatropium) up to every 4 hours if your proair inhaler doesn't relieve you Work on inhaler technique:   For cough > mucinex dm 1200 mg every 12 hours and use flutter as much as possible  Please schedule a follow up visit in 3 months but call sooner if needed     06/27/2018  f/u ov/Debra Page re: GOLD III / group B symptoms  Chief Complaint  Patient presents with  . Follow-up    Breathing is unchanged. She is using her albuterol inhaler 3-4 x per day and neb with albuterol 3-4 x per wk.   Dyspnea:  MMRC3 = can't walk 100 yards even at a slow pace at  a flat grade s stopping due to sob   Cough: not as much of a problem but mucus is  thick / white esp in am >> not using mucinex / flutter as rec  Sleeping: 45 degrees on side  SABA use: as above/ still struggling with hfa  rec Finish your bevespi as we showed you today and if not making progress call before you run out for the same medication in nebulizer (performist and yupelri) The key is to stop smoking completely before smoking completely stops you!  For cough > mucinex   1200 mg every 12 hours and use the flutter valve     10/24/2018  f/u ov/Debra Page re: GOLD III/ still smoking/ maint on bevespi Chief Complaint  Patient presents with  . Follow-up    Breathing is unchanged. She is using her albuterol inhaler 3 x per wk and neb with albuterol 2 x per day on average.    Dyspnea:  MMRC3 = can't walk 100 yards even at a slow pace at a flat grade s stopping due to sob   Cough: usually at hs min mucoid Sleeping:  uncomfortable due to back problems  SABA use: as above  rec Plan A = Automatic =  Bevespi  =  Yupelri once  each am and  performist twice daily  Plan B = Backup Only use your albuterol Proair) inhaler  Plan C = Crisis - only use your albuterol nebulizer if you first try Plan B and it fails to help > ok to use the nebulizer up to every 4 hours but if start needing it regularly call for immediate appointment   04/04/2019  f/u ov/Debra Page re: copd III/ still smoking  Chief Complaint  Patient presents with  . Follow-up    Breathing is doing better. She is using her albuterol 3 x per wk on average and albuterol neb 2 x per day.  Dyspnea:   Doe room to room  Cough: none  Sleeping: sitting up due to back  SABA use: as above, usually when over does it  02: none  New bilateral swelling >cards w/u req by PCP  rec The key is to stop smoking completely before smoking completely stops you! Remember Only use your albuterol as a rescue medication   Virtual Visit via Telephone Note 11/07/2019   I connected with Debra Page on 11/07/19 at  1:15 PM EDT by telephone and verified that I am speaking with the correct person using two identifiers.   I discussed the limitations, risks, security and privacy concerns of performing an evaluation and management service by telephone and the availability of in person appointments. I also discussed with the patient that there may be a patient responsible charge related to this service. The patient expressed understanding and agreed to proceed.   History of Present Illness: copd III/ yupelri / performist nebs Dyspnea:  Room to room  Cough: not much at all  Sleeping: better / 3-4 pillows on bed flat SABA use: hfa twice daily/ neb once a day if over does it   02: none   No obvious day to day or daytime variability or assoc excess/ purulent sputum or mucus plugs or hemoptysis or cp or chest tightness, subjective wheeze or overt sinus or hb symptoms.    Also  denies any obvious fluctuation of symptoms with weather or environmental changes or other aggravating or alleviating factors except as outlined above.   Meds reviewed/ med reconciliation completed  Observations/Objective: Somewhat congested sounding smoker's cough with mild hoarseness/ no conversational sob    Assessment and Plan: See problem list for active a/p's   Follow Up Instructions: See avs for instructions unique to this ov which includes revised/ updated med list     I discussed the assessment and treatment plan with the patient. The patient was provided an opportunity to ask questions and all were answered. The patient agreed with the plan and demonstrated an understanding of the instructions.   The patient was advised to call back or seek an in-person evaluation if the symptoms worsen or if the condition fails to improve as anticipated.  I provided 24  minutes of non-face-to-face time during this encounter.   Sandrea Hughs, MD                 Assessment:

## 2019-11-07 NOTE — Assessment & Plan Note (Signed)
Counseled re importance of smoking cessation but did not meet time criteria for separate billing     F/u @ 6 months at [place] office, call sooner prn    Each maintenance medication was reviewed in detail including most importantly the difference between maintenance and as needed and under what circumstances the prns are to be used.  Please see AVS for specific  Instructions which are unique to this visit and I personally typed out  which were reviewed in detail over the phone with the patient and a copy provided via MyChart

## 2019-12-01 ENCOUNTER — Other Ambulatory Visit: Payer: Self-pay | Admitting: Internal Medicine

## 2020-01-02 ENCOUNTER — Other Ambulatory Visit: Payer: Self-pay | Admitting: Internal Medicine

## 2020-01-18 ENCOUNTER — Other Ambulatory Visit: Payer: Self-pay | Admitting: Internal Medicine

## 2020-01-18 DIAGNOSIS — J449 Chronic obstructive pulmonary disease, unspecified: Secondary | ICD-10-CM

## 2020-02-29 ENCOUNTER — Other Ambulatory Visit: Payer: Self-pay | Admitting: Internal Medicine

## 2020-02-29 DIAGNOSIS — J449 Chronic obstructive pulmonary disease, unspecified: Secondary | ICD-10-CM

## 2020-03-25 ENCOUNTER — Other Ambulatory Visit: Payer: Self-pay | Admitting: Internal Medicine

## 2020-04-08 ENCOUNTER — Other Ambulatory Visit: Payer: Self-pay | Admitting: Internal Medicine

## 2020-04-08 DIAGNOSIS — J449 Chronic obstructive pulmonary disease, unspecified: Secondary | ICD-10-CM

## 2020-04-27 ENCOUNTER — Other Ambulatory Visit: Payer: Self-pay | Admitting: Internal Medicine

## 2020-04-27 DIAGNOSIS — J449 Chronic obstructive pulmonary disease, unspecified: Secondary | ICD-10-CM

## 2020-05-01 ENCOUNTER — Other Ambulatory Visit: Payer: Self-pay | Admitting: Internal Medicine

## 2020-05-15 ENCOUNTER — Other Ambulatory Visit: Payer: Self-pay | Admitting: Internal Medicine

## 2020-05-15 DIAGNOSIS — J449 Chronic obstructive pulmonary disease, unspecified: Secondary | ICD-10-CM

## 2020-06-05 ENCOUNTER — Other Ambulatory Visit: Payer: Self-pay | Admitting: Internal Medicine

## 2020-06-19 ENCOUNTER — Other Ambulatory Visit: Payer: Self-pay | Admitting: Internal Medicine

## 2020-06-19 DIAGNOSIS — J449 Chronic obstructive pulmonary disease, unspecified: Secondary | ICD-10-CM

## 2020-07-08 ENCOUNTER — Ambulatory Visit: Payer: Medicaid Other | Admitting: Internal Medicine

## 2020-07-08 ENCOUNTER — Other Ambulatory Visit: Payer: Self-pay | Admitting: Internal Medicine

## 2020-08-01 ENCOUNTER — Telehealth: Payer: Self-pay | Admitting: Internal Medicine

## 2020-08-01 NOTE — Telephone Encounter (Signed)
Called and spoke with Patient.  Patient made aware Dr. Sherene Sires is fine with televist.  Nothing further at this time.

## 2020-08-01 NOTE — Telephone Encounter (Signed)
Fine with me

## 2020-08-01 NOTE — Telephone Encounter (Signed)
Not vaccinated and not comfortable leaving home.  Wants to change OV to televisit.  Advised we would check with Dr. Sherene Sires and then let her know what he says and then call her back with his response.  Dr. Sherene Sires, Please advise if it is ok to change office visit to televisit.  Thank you.

## 2020-08-05 ENCOUNTER — Encounter: Payer: Self-pay | Admitting: Internal Medicine

## 2020-08-05 ENCOUNTER — Ambulatory Visit (INDEPENDENT_AMBULATORY_CARE_PROVIDER_SITE_OTHER): Payer: Medicaid Other | Admitting: Internal Medicine

## 2020-08-05 ENCOUNTER — Other Ambulatory Visit: Payer: Self-pay

## 2020-08-05 DIAGNOSIS — J449 Chronic obstructive pulmonary disease, unspecified: Secondary | ICD-10-CM

## 2020-08-05 NOTE — Patient Instructions (Signed)
Make sure you check your oxygen saturation at your highest level of activity to be sure it stays over 90% and keep track of it at least once a week, more often if breathing getting worse, and let me know if losing ground.   Congratulations on not smoking    I very strongly recommend you get the moderna or pfizer vaccine as soon as possible based on your risk of dying from the virus  and the proven safety and benefit of these vaccines against even the delta variant.  This can save your life as well as  those of your loved ones,  especially if they are also not vaccinated.    Please schedule a follow up visit in 6 months but call sooner if needed

## 2020-08-05 NOTE — Assessment & Plan Note (Addendum)
Quit smoking 06/2020 / MM Spirometry 04/20/2017  FEV1 0.94 (39%)  Ratio 56 p am symb/duoneb w/in 2 h - 04/20/2017   Walked RA x one lap @ 185 stopped due to  Sob/ fast pace, no desat - 04/20/2017  After extensive coaching HFA effectiveness =    50%  > try bevespi 2bid  No better  - PFT's  06/02/2017  FEV1 0.79  (33 % ) ratio 45  p no % improvement from saba p symbicort x 2 pffs  prior to study with DLCO  66/66c % corrects to 86  % for alv volume   - added flutter valve 06/02/2017\ - Alpha One Screening 06/02/17  MM  Level 158 - 06/02/2017    try spiriva smi 2 qam and gerd rx > no better / thrush on symbiocrt 160 @ ov 07/19/2017  - 07/19/2017    changed back to bevespi 2 bid  - 03/11/2018   Walked RA  2 laps @ 185 ft each stopped due to  Sob with sats ok/ relatively slow pace  - 10/24/2018  After extensive coaching inhaler device,  effectiveness =    50% at most due to very short Ti so rec change  to [place] and Perforomist in nebulizer form  Pt is Group B in terms of symptom/risk and laba/lama therefore appropriate rx at this point >>>  Continue yupelri and performist   Re saba I spent extra time with pt today reviewing appropriate use of albuterol for prn use on exertion with the following points: 1) saba is for relief of sob that does not improve by walking a slower pace or resting but rather if the pt does not improve after trying this first. 2) If the pt is convinced, as many are, that saba helps recover from activity faster then it's easy to tell if this is the case by re-challenging : ie stop, take the inhaler, then p 5 minutes try the exact same activity (intensity of workload) that just caused the symptoms and see if they are substantially diminished or not after saba 3) if there is an activity that reproducibly causes the symptoms, try the saba 15 min before the activity on alternate days   If in fact the saba really does help, then fine to continue to use it prn but advised may need to look  closer at the maintenance regimen being used to achieve better control of airways disease with exertion.    Also advise:  Make sure you check your oxygen saturation at your highest level of activity to be sure it stays over 90% and keep track of it at least once a week, more often if breathing getting worse, and let me know if losing ground.   Each maintenance medication was reviewed in detail including most importantly the difference between maintenance and as needed and under what circumstances the prns are to be used.  Please see AVS for specific  Instructions which are unique to this visit and I personally typed out  which were reviewed in detail over the phone with the patient and a copy provided my chart

## 2020-08-05 NOTE — Progress Notes (Signed)
Subjective:     Patient ID: Debra Page, female   DOB: 1966-08-09,     MRN: [id number]    Brief patient profile:  20   yowf  MM/quit smoking Jun 24 2020 from [place]  With healthy childhood but with onset around 2008 @ wt around  150  doe and had to stop doing housekeeping and on disability since 2013 for copd and referred to pulmonary clinic 04/20/2017 by Dr   Colvin Caroli on dulera/symb and nebulizer with GOLD III copd criteria at initial eval    History of Present Illness  04/20/2017 1st [facility] office visit/ Gerrald Basu  Symb/duoneb/proair Chief Complaint  Patient presents with  . Pulmonary Consult    Referred by Dr. Colvin Caroli. Pt states that she was dxed with COPD in 2009. She states that she gets winded walking short distances such as from room to room at home. She also c/o cough- worse at night and first thing in the am- clear sputum.    uses neb first thing in am then symbicort w/in half an hour which was about 2 h prior to OV   Last pred was nov 2017 ? Benefit  Wakes up congested/ wheezy but no purulent sputum Doe= MMRC3 = can't walk 100 yards even at a slow pace at a flat grade s stopping due to sob  Onset was indolent and pattern is progressive and parallels wt gain also Worse breathing when exp to strong odors/ heat and humidity  rec Plan A = Automatic = Bevespi Take 2 puffs first thing in am and then another 2 puffs about 12 hours later.  Work on inhaler technique:  Plan B = Backup Only use your albuterol (proair) Plan C = Crisis - only use your albuterol nebulizer if you first try Plan B and it fails to help > ok to use the nebulizer up to every 4 hours but if start needing it regularly call for immediate appointment Take Prednisone 10 mg x 4 for three days 3 for three days 2 for three days 1 for three days and stop      03/11/2018  f/u ov/Jahrell Hamor re: copd III/ group B still smoking  Chief Complaint  Patient presents with  . Follow-up    Cough and SOB are  unchanged. She is using proair 2-3 x per day and duoneb 3-4 x per wk on average.   Dyspnea:  MMRC3 = can't walk 100 yards even at a slow pace at a flat grade s stopping due to sob   Cough: worse in am's thick and white / not using mucinex or flutter as rec  Sleeping: 45 degrees 2-3 am needs rx  SABA use: down some 02: no rec The key is to stop smoking completely before smoking completely stops you!  Change your neb to albuterol (not containing ipatropium) up to every 4 hours if your proair inhaler doesn't relieve you Work on inhaler technique:   For cough > mucinex dm 1200 mg every 12 hours and use flutter as much as possible  Please schedule a follow up visit in 3 months but call sooner if needed     06/27/2018  f/u ov/Kashawna Manzer re: GOLD III / group B symptoms  Chief Complaint  Patient presents with  . Follow-up    Breathing is unchanged. She is using her albuterol inhaler 3-4 x per day and neb with albuterol 3-4 x per wk.   Dyspnea:  MMRC3 = can't walk 100 yards even at a  slow pace at a flat grade s stopping due to sob   Cough: not as much of a problem but mucus is  thick / white esp in am >> not using mucinex / flutter as rec  Sleeping: 45 degrees on side  SABA use: as above/ still struggling with hfa  rec Finish your bevespi as we showed you today and if not making progress call before you run out for the same medication in nebulizer (performist and yupelri) The key is to stop smoking completely before smoking completely stops you!  For cough > mucinex   1200 mg every 12 hours and use the flutter valve     10/24/2018  f/u ov/Mearl Olver re: GOLD III/ still smoking/ maint on bevespi Chief Complaint  Patient presents with  . Follow-up    Breathing is unchanged. She is using her albuterol inhaler 3 x per wk and neb with albuterol 2 x per day on average.    Dyspnea:  MMRC3 = can't walk 100 yards even at a slow pace at a flat grade s stopping due to sob   Cough: usually at hs min  mucoid Sleeping: uncomfortable due to back problems  SABA use: as above  rec Plan A = Automatic =  Bevespi  =  Yupelri once  each am and  performist twice daily  Plan B = Backup Only use your albuterol Proair) inhaler  Plan C = Crisis - only use your albuterol nebulizer if you first try Plan B and it fails to help > ok to use the nebulizer up to every 4 hours but if start needing it regularly call for immediate appointment   04/04/2019  f/u ov/Jontez Redfield re: copd III/ still smoking  Chief Complaint  Patient presents with  . Follow-up    Breathing is doing better. She is using her albuterol 3 x per wk on average and albuterol neb 2 x per day.  Dyspnea:   Doe room to room  Cough: none  Sleeping: sitting up due to back  SABA use: as above, usually when over does it  02: none  New bilateral swelling >cards w/u req by PCP  rec The key is to stop smoking completely before smoking completely stops you! Try albuterol nebulizer 15 min before an activity that you know would make you short of breath and see if it makes any difference and if makes none then don't take it after activity unless you can't catch your breath. Please schedule a follow up visit in 6 months but call sooner if needed  with all medications /inhalers/ solutions in hand so we can verify exactly what you are taking. This includes all medications from all doctors and over the counters  -  [place] office.    08/05/2020  f/u televisit  office/Jabori Henegar re: GOLD  III/ B  On yupelri/performist  Chief Complaint  Patient presents with  . Follow-up    Shortness of breath with activity   Virtual Visit via Telephone Note 08/05/2020   I connected with Debra Page on 08/05/20 at  2:15 PM EST by telephone and verified that I am speaking with the correct person using two identifiers. Pt is at home and this call made from my office with no other participants    I discussed the limitations, risks, security and privacy concerns  of performing an evaluation and management service by telephone and the availability of in person appointments. I also discussed with the patient that there may be a patient responsible charge  related to this service. The patient expressed understanding and agreed to proceed.   History of Present Illness: stopped smoking around Jun 24 2020 / maint on yupelri / performist  Dyspnea:  Room room  Cough: improving off cigs  Sleeping: up against the headboard with pillows propped up against x sev years SABA use: some less inhaler, def less neb  02: none / checks sats 92-98% does not checking    No obvious day to day or daytime variability or assoc excess/ purulent sputum or mucus plugs or hemoptysis or cp or chest tightness, subjective wheeze or overt sinus or hb symptoms.    Also denies any obvious fluctuation of symptoms with weather or environmental changes or other aggravating or alleviating factors except as outlined above.   Meds reviewed/ med reconciliation completed     Current Meds  Medication Sig  . albuterol (PROVENTIL) (2.5 MG/3ML) 0.083% nebulizer solution INHALE CONTENTS OF ONE VIAL VIA NEBULIZER EVERY 6 HOURS AS NEEDED FOR WHEEZING OR SHORTNESS OF BREATH  . Cetirizine HCl (ZYRTEC ALLERGY) 10 MG CAPS Take 1 capsule (10 mg total) by mouth daily.  . formoterol (PERFOROMIST) 20 MCG/2ML nebulizer solution Take 2 mLs (20 mcg total) by nebulization 2 (two) times daily.  Marland Kitchen ibuprofen (ADVIL) 200 MG tablet Take 200 mg by mouth every 6 (six) hours as needed.  Marland Kitchen levothyroxine (SYNTHROID, LEVOTHROID) 100 MCG tablet Take 100 mcg by mouth daily.  Marland Kitchen oxybutynin (DITROPAN) 5 MG tablet Take 5 mg by mouth 3 (three) times daily.  . pantoprazole (PROTONIX) 40 MG tablet TAKE ONE TABLET BY MOUTH 30-60 MINUTES BEFORE THE FIRST MEAL OF THE DAY.  Marland Kitchen PAZEO 0.7 % SOLN As directed  . PROAIR HFA 108 (90 Base) MCG/ACT inhaler INHALE 1-2 PUFFS EVERY 4 TO 6 HOURS AS NEEDED FOR RESCUE  . Respiratory Therapy  Supplies (FLUTTER) DEVI 1 Device by Does not apply route as needed.  . RESTASIS 0.05 % ophthalmic emulsion Apply 1 drop to eye 2 (two) times daily.  . simvastatin (ZOCOR) 20 MG tablet Take 20 mg by mouth at bedtime.  Mikael Spray 175 MCG/3ML nebulizer solution INHALE CONTENTS OF ONE VIAL VIA NEBULIZER INTO THE LUNGS ONCE DAILY         Observations/Objective: slt gruff voice but no conversational sob or rattling on voluntary cough    Assessment and Plan: See problem list for active a/p's   Follow Up Instructions: See avs for instructions unique to this ov which includes revised/ updated med list     I discussed the assessment and treatment plan with the patient. The patient was provided an opportunity to ask questions and all were answered. The patient agreed with the plan and demonstrated an understanding of the instructions.   The patient was advised to call back or seek an in-person evaluation if the symptoms worsen or if the condition fails to improve as anticipated.  I provided 24 minutes of non-face-to-face time during this encounter.   Sandrea Hughs, MD

## 2020-08-08 ENCOUNTER — Other Ambulatory Visit: Payer: Self-pay | Admitting: Internal Medicine

## 2020-08-08 DIAGNOSIS — J449 Chronic obstructive pulmonary disease, unspecified: Secondary | ICD-10-CM

## 2020-10-07 ENCOUNTER — Other Ambulatory Visit: Payer: Self-pay | Admitting: Internal Medicine

## 2020-10-16 ENCOUNTER — Other Ambulatory Visit: Payer: Self-pay | Admitting: Internal Medicine

## 2020-10-16 DIAGNOSIS — J449 Chronic obstructive pulmonary disease, unspecified: Secondary | ICD-10-CM

## 2020-10-18 ENCOUNTER — Other Ambulatory Visit: Payer: Self-pay | Admitting: Internal Medicine

## 2020-10-18 MED ORDER — YUPELRI 175 MCG/3ML IN SOLN: RESPIRATORY_TRACT | 3 refills | Status: DC

## 2020-12-08 ENCOUNTER — Telehealth: Payer: Self-pay | Admitting: *Deleted

## 2020-12-08 NOTE — Telephone Encounter (Signed)
PA for yupelri has been initiated.  MW will need to sign the form to send back to the insurance to approve or deny medication.

## 2020-12-10 NOTE — Telephone Encounter (Signed)
The form is in [place] and MW in [place] this wk, so will need someone to send it over for him to sign  Tried calling the pt and there was no answer and no option to leave msg  I am in [place] 12/11/20 and will fax to [place] to be signed by MW when I get there  Will hold to followup

## 2020-12-12 NOTE — Telephone Encounter (Signed)
Received [place] [employer] and [facility] [place] [facility] Prior Approval Request for Yupelri Solution, Dr Sherene Sires signed and faxed paperwork back to [employer] at [phone] with success result on 12/12/2020 at 10:39am.

## 2020-12-12 NOTE — Telephone Encounter (Signed)
Will keep encounter open while we await an update on this. Routing encounter back to [place] for her to be able to keep an eye out for an update since she is the one who mainly handles Dr. Thurston Hole things.

## 2020-12-12 NOTE — Telephone Encounter (Signed)
I have faxed the PA form for Yupelri to th [place] office for MW to sign attn Victorino Dike

## 2020-12-13 NOTE — Telephone Encounter (Signed)
I have not received anything  Spoke with pharm and was advised that they faxed over request for PA on perforomist to be done as well  She states that the Mikael Spray was denied according to [facility]  Will keep an eye out for form

## 2020-12-17 ENCOUNTER — Telehealth: Payer: Self-pay | Admitting: Internal Medicine

## 2020-12-17 NOTE — Telephone Encounter (Signed)
Opened in error- please see previous phone note

## 2020-12-17 NOTE — Telephone Encounter (Signed)
I called and spoke with patient about PA for Perforomist. We have them form and it is getting done and sent to insurance and when we hear back from them ,we will reach out. Patient also asked to contact [facility] pharmacy to let them and I did. Well keep encounter open until we hear back about  PA.

## 2020-12-17 NOTE — Telephone Encounter (Signed)
Pharmacy name: [facility] requested: Perforomist CMM?: yes Key: [id number] Covered alternatives: none listed Tried and failed: bevespi, symbicort 160, yupelri, spiriva 2.5 Decision: sent to plan, determination expected within 3 days.

## 2020-12-19 MED ORDER — SPIRIVA RESPIMAT 2.5 MCG/ACT IN AERS: 2.0000 | INHALATION_SPRAY | Freq: Every day | RESPIRATORY_TRACT | 5 refills | Status: DC

## 2020-12-19 NOTE — Telephone Encounter (Signed)
Called and spoke with patient. She is aware that we will send in the Spiriva inhaler. She verbalized understanding of instructions. She wishes to have this send into [employer].   I was able to get her scheduled for a OV on 01/09/21 at 1130. This would allow her enough time to arrange for transportation with [employer].   Advised her to give our office a call if anything changes or if she needs anything else.

## 2020-12-19 NOTE — Telephone Encounter (Signed)
PA is still in process per [email or web address] 

## 2020-12-19 NOTE — Telephone Encounter (Signed)
Try spiriva 2.5  X 2 puffs each am but will need ov asap to show how to use effectively, ok to schedule with NP if I'm not available

## 2020-12-19 NOTE — Telephone Encounter (Signed)
Received letter from [facility] denied by [facility]   You can view covered meds at the link below   [email or web address]  Please advise, thanks

## 2020-12-31 ENCOUNTER — Telehealth: Payer: Self-pay | Admitting: *Deleted

## 2020-12-31 NOTE — Telephone Encounter (Signed)
PA was initiated on 12/17/2020 for the formoterol fumarate.    This was approved on 12/18/2020 and is good through 12/13/2021.

## 2021-01-09 ENCOUNTER — Ambulatory Visit: Payer: Medicaid Other | Admitting: Internal Medicine

## 2021-01-15 ENCOUNTER — Telehealth: Payer: Self-pay | Admitting: Internal Medicine

## 2021-01-15 DIAGNOSIS — J449 Chronic obstructive pulmonary disease, unspecified: Secondary | ICD-10-CM

## 2021-01-15 MED ORDER — ALBUTEROL SULFATE (2.5 MG/3ML) 0.083% IN NEBU: INHALATION_SOLUTION | RESPIRATORY_TRACT | 5 refills | Status: DC

## 2021-01-15 NOTE — Telephone Encounter (Signed)
Called and spoke with patient regarding PA. Patient states it was sent in at the end of last week or beginning of this week. Could not find PA. Called and spoke with Wynona Canes at [employer] [facility] in [place]. She states that they were requesting refill authorization, PA is not needed.   Refills sent in. Called to let the patient know that the refills has been sent in the pharmacy. Patient voiced understanding.  Nothing further needed at this time.

## 2021-02-12 ENCOUNTER — Ambulatory Visit: Payer: Medicaid Other | Admitting: Internal Medicine

## 2021-03-12 ENCOUNTER — Ambulatory Visit: Payer: Medicaid Other | Admitting: Internal Medicine

## 2021-03-18 ENCOUNTER — Other Ambulatory Visit: Payer: Self-pay

## 2021-03-18 MED ORDER — PANTOPRAZOLE SODIUM 40 MG PO TBEC: DELAYED_RELEASE_TABLET | ORAL | 4 refills | Status: DC

## 2021-03-18 MED ORDER — ALBUTEROL SULFATE HFA 108 (90 BASE) MCG/ACT IN AERS: INHALATION_SPRAY | RESPIRATORY_TRACT | 4 refills | Status: DC

## 2021-04-14 ENCOUNTER — Other Ambulatory Visit: Payer: Self-pay | Admitting: Internal Medicine

## 2021-04-14 DIAGNOSIS — J449 Chronic obstructive pulmonary disease, unspecified: Secondary | ICD-10-CM

## 2021-04-23 ENCOUNTER — Other Ambulatory Visit: Payer: Self-pay

## 2021-04-23 ENCOUNTER — Ambulatory Visit: Payer: Medicaid Other | Admitting: Internal Medicine

## 2021-04-23 ENCOUNTER — Encounter: Payer: Self-pay | Admitting: Internal Medicine

## 2021-04-23 VITALS — BP 138/90 | HR 101 | Temp 97.2°F | Ht 60.0 in | Wt 245.0 lb

## 2021-04-23 DIAGNOSIS — J449 Chronic obstructive pulmonary disease, unspecified: Secondary | ICD-10-CM

## 2021-04-23 DIAGNOSIS — J9611 Chronic respiratory failure with hypoxia: Secondary | ICD-10-CM | POA: Diagnosis not present

## 2021-04-23 MED ORDER — STIOLTO RESPIMAT 2.5-2.5 MCG/ACT IN AERS: 2.0000 | INHALATION_SPRAY | Freq: Every day | RESPIRATORY_TRACT | 0 refills | Status: DC

## 2021-04-23 NOTE — Assessment & Plan Note (Addendum)
Quit smoking 03/2021  / [facility] Spirometry 04/20/2017  FEV1 0.94 (39%)  Ratio 56 p am symb/duoneb w/in 2 h - 04/20/2017   Walked RA x one lap @ 185 stopped due to  Sob/ fast pace, no desat - 04/20/2017  After extensive coaching HFA effectiveness =    50%  > try bevespi 2bid  No better  - PFT's  06/02/2017  FEV1 0.79  (33 % ) ratio 45  p no % improvement from saba p symbicort x 2 pffs  prior to study with DLCO  66/66c % corrects to 86  % for alv volume   - added flutter valve 06/02/2017\ - Alpha One Screening 06/02/17  MM  Level 158 - 06/02/2017    try spiriva smi 2 qam and gerd rx > no better / thrush on symbiocrt 160 @ ov 07/19/2017  - 07/19/2017    changed back to bevespi 2 bid  - 03/11/2018   Walked RA  2 laps @ 185 ft each stopped due to  Sob with sats ok/ relatively slow pace  - 10/24/2018  After extensive coaching inhaler device,  effectiveness =    50% at most due to very short Ti so rec change  to [place] and Perforomist in nebulizer form> improved until insurance stopped paying for it - 04/23/2021  After extensive coaching inhaler device,  effectiveness =    75% (short ti) try stiolto 2 pffs each am and prn saba   Pt is Group B in terms of symptom/risk and laba/lama therefore appropriate rx at this point >>>  Prefer neb lama/lab but until can get reimbursed try stiolto  Plus approp saba:  I spent extra time with pt today reviewing appropriate use of albuterol for prn use on exertion with the following points: 1) saba is for relief of sob that does not improve by walking a slower pace or resting but rather if the pt does not improve after trying this first. 2) If the pt is convinced, as many are, that saba helps recover from activity faster then it's easy to tell if this is the case by re-challenging : ie stop, take the inhaler, then p 5 minutes try the exact same activity (intensity of workload) that just caused the symptoms and see if they are substantially diminished or not after saba 3) if there is  an activity that reproducibly causes the symptoms, try the saba 15 min before the activity on alternate days   If in fact the saba really does help, then fine to continue to use it prn but advised may need to look closer at the maintenance regimen being used to achieve better control of airways disease with exertion.

## 2021-04-23 NOTE — Progress Notes (Signed)
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 Colvin Caroli. Pt states that she was dxed with COPD in 2009. She states that she gets winded walking short distances such as from room to room at home. She also c/o cough- worse at night and first thing in the am- clear sputum.    uses neb first thing in am then symbicort w/in half an hour which was about 2 h prior to OV   Last pred was nov 2017 ? [facility]  Wakes up congested/ wheezy but no purulent sputum Doe= MMRC3 = can't walk 100 yards even at a slow pace at a flat grade s stopping due to sob  Onset was indolent and pattern is progressive and parallels wt gain also Worse breathing when exp to strong odors/ heat and humidity  rec Plan A = Automatic = Bevespi Take 2 puffs first thing in am and then another 2 puffs about 12 hours later.  Work on inhaler technique:  Plan B = Backup Only use your albuterol (proair) Plan C = Crisis - only use your albuterol nebulizer if you first try Plan B and it fails to help > ok to use the nebulizer up to every 4 hours but if start needing it regularly call for immediate appointment Take Prednisone 10 mg x 4 for three days 3 for three days 2 for three days 1 for three days and stop      03/11/2018  f/u ov/Tamey Wanek re: copd III/ group B still smoking  Chief Complaint  Patient presents with   Follow-up    Cough and SOB are  unchanged. She is using proair 2-3 x per day and duoneb 3-4 x per wk on average.   Dyspnea:  MMRC3 = can't walk 100 yards even at a slow pace at a flat grade s stopping due to sob   Cough: worse in am's thick and white / not using mucinex or flutter as rec  Sleeping: 45 degrees 2-3 am needs rx  SABA use: down some 02: no rec The key is to stop smoking completely before smoking completely stops you!  Change your neb to albuterol (not containing ipatropium) up to every 4 hours if your proair inhaler doesn't relieve you Work on inhaler technique:   For cough > mucinex dm 1200 mg every 12 hours and use flutter as much as possible  Please schedule a follow up visit in 3 months but call sooner if needed     06/27/2018  f/u ov/Lizbet Cirrincione re: GOLD III / group B symptoms  Chief Complaint  Patient presents with   Follow-up    Breathing is unchanged. She is using her albuterol inhaler 3-4 x per day and neb with albuterol 3-4 x per wk.   Dyspnea:  MMRC3 = can't walk 100 yards even at a  slow pace at a flat grade s stopping due to sob   Cough: not as much of a problem but mucus is  thick / white esp in am >> not using mucinex / flutter as rec  Sleeping: 45 degrees on side  SABA use: as above/ still struggling with hfa  rec Finish your bevespi as we showed you today and if not making progress call before you run out for the same medication in nebulizer (performist and yupelri) The key is to stop smoking completely before smoking completely stops you!  For cough > mucinex   1200 mg every 12 hours and use the flutter valve     10/24/2018  f/u ov/Isamu Trammel re: GOLD III/ still smoking/ maint on bevespi Chief Complaint  Patient presents with   Follow-up    Breathing is unchanged. She is using her albuterol inhaler 3 x per wk and neb with albuterol 2 x per day on average.    Dyspnea:  MMRC3 = can't walk 100 yards even at a slow pace at a flat grade s stopping due to sob   Cough: usually at hs min mucoid Sleeping:  uncomfortable due to back problems  SABA use: as above  rec Plan A = Automatic =  Bevespi  =  Yupelri once  each am and  performist twice daily  Plan B = Backup Only use your albuterol Proair) inhaler  Plan C = Crisis - only use your albuterol nebulizer if you first try Plan B and it fails to help > ok to use the nebulizer up to every 4 hours but if start needing it regularly call for immediate appointment   04/04/2019  f/u ov/Mehak Roskelley re: copd III/ still smoking  Chief Complaint  Patient presents with   Follow-up    Breathing is doing better. She is using her albuterol 3 x per wk on average and albuterol neb 2 x per day.  Dyspnea:   Doe room to room  Cough: none  Sleeping: sitting up due to back  SABA use: as above, usually when over does it  02: none  New bilateral swelling >cards w/u req by PCP  Rec The key is to stop smoking completely before smoking completely stops you! Remember Only use your albuterol as a rescue medication   04/23/2021  f/u ov/[place] [facility]/Silver Achey re:  COPD GOLD III/ maint on spiriva  Chief Complaint  Patient presents with   Follow-up    Increased SOB past 2 months- felt better on yupelri/performist- worse since switched to Spiriva respimat. O2 sat RA at rest 85%--increased to 96%2lpm cont o2.     Dyspnea:  very sedentary/ only goes out to see Doctor Cough: none  Sleeping: lift recliner straight up  SABA use: hfa and neb since stopped yupelri/formoterol 02: none  Covid status: never vax     No obvious day to day or daytime variability or assoc excess/ purulent sputum or mucus plugs or hemoptysis or cp or chest tightness, subjective wheeze or overt sinus or hb symptoms.   Sleeping as above  without nocturnal  or early am exacerbation  of respiratory  c/o's or need for noct saba. Also denies any obvious fluctuation of symptoms with weather or environmental changes or other aggravating or alleviating factors except as outlined above   No unusual exposure  hx or h/o childhood pna/ asthma or knowledge of premature birth.  Current Allergies, Complete Past Medical History, Past Surgical History, Family History, and Social History were reviewed in [employer]  electronic medical record.  ROS  The following are not active complaints unless bolded Hoarseness, sore throat, dysphagia, dental problems, itching, sneezing,  nasal congestion or discharge of excess mucus or purulent secretions, ear ache,   fever, chills, sweats, unintended wt loss or wt gain, classically pleuritic or exertional cp,  orthopnea pnd or arm/hand swelling  or leg swelling, presyncope, palpitations, abdominal pain, anorexia, nausea, vomiting, diarrhea  or change in bowel habits or change in bladder habits, change in stools or change in urine, dysuria, hematuria,  rash, arthralgias, visual complaints, headache, numbness, weakness or ataxia or problems with walking or coordination,  change in mood or  memory.        Current Meds  Medication Sig   albuterol (PROAIR HFA) 108 (90 Base) MCG/ACT inhaler INHALE 1-2 PUFFS EVERY 4 TO 6 HOURS AS NEEDED FOR RESCUE   albuterol (PROVENTIL) (2.5 MG/3ML) 0.083% nebulizer solution INHALE CONTENTS OF 1 VIAL BY MOUTH VIA NEBULIZER EVERY 6 HOURS AS NEEDED FOR FOR WHEEZING OR SHORTNESS OF BREATH   Cetirizine HCl (ZYRTEC ALLERGY) 10 MG CAPS Take 1 capsule (10 mg total) by mouth daily.   ibuprofen (ADVIL) 200 MG tablet Take 200 mg by mouth every 6 (six) hours as needed.   levothyroxine (SYNTHROID, LEVOTHROID) 100 MCG tablet Take 100 mcg by mouth daily.   pantoprazole (PROTONIX) 40 MG tablet TAKE ONE TABLET BY MOUTH 30-60 MINUTES BEFORE THE FIRST MEAL OF THE DAY.   Respiratory Therapy Supplies (FLUTTER) DEVI 1 Device by Does not apply route as needed.   RESTASIS 0.05 % ophthalmic emulsion Apply 1 drop to eye 2 (two) times daily.   simvastatin (ZOCOR) 20 MG tablet Take 20 mg by mouth at bedtime.   Tiotropium Bromide-Olodaterol (STIOLTO RESPIMAT) 2.5-2.5  MCG/ACT AERS Inhale 2 puffs into the lungs daily.   [DISCONTINUED] Tiotropium Bromide Monohydrate (SPIRIVA RESPIMAT) 2.5 MCG/ACT AERS Inhale 2 puffs into the lungs daily.                      Objective:   Physical Exam      04/23/2021      245  04/04/2019      234 10/24/2018        224  06/27/2018      225  03/11/2018      224  10/18/2017      220  08/27/2017         214 07/19/2017     212  06/02/2017    210   04/20/17 207 lb 6.4 oz (94.1 kg)  01/23/12 187 lb (84.8 kg)     Vital signs reviewed  04/23/2021  - Note at rest 02 sats  85% on RA   General appearance:    obese wf nad    HEENT : pt wearing mask not removed for exam due to covid -19 concerns.    NECK :  without JVD/Nodes/TM/ nl carotid upstrokes bilaterally   LUNGS: no acc muscle use,  Mod barrel  contour chest wall with bilateral  Distant bs s audible wheeze and  without cough on insp or exp maneuvers and mod  Hyperresonant  to  percussion bilaterally     CV:  RRR  no s3 or murmur or increase in P2, and  trace edema both LEs  ABD:  obese soft and nontender with pos mid insp Hoover's  in the supine position. No bruits or organomegaly appreciated, bowel sounds nl  MS:     ext warm without  deformities, calf tenderness, cyanosis or clubbing No obvious joint restrictions   SKIN: warm and dry without lesions    NEURO:  alert, approp, nl sensorium with  no motor or cerebellar deficits apparent.                   Assessment:

## 2021-04-23 NOTE — Patient Instructions (Addendum)
Plan A = Automatic = Always=    stop spiriva and start stiolto 2 puffs each am (same as yupelri and performist)  Work on inhaler technique:  relax and gently blow all the way out then take a nice smooth full deep breath back in, triggering the inhaler at same time you start breathing in.  Hold for up to 5 seconds if you can. Blow out thru nose. Rinse and gargle with water when done.  If mouth or throat bother you at all,  try brushing teeth/gums/tongue with arm and hammer toothpaste/ make a slurry and gargle and spit out.   - we will see if [facility] can supply the yupelir /performist but use the stiolto until then   Plan B = Backup (to supplement plan A, not to replace it) Only use your albuterol inhaler as a rescue medication to be used if you can't catch your breath by resting or doing a relaxed purse lip breathing pattern.  - The less you use it, the better it will work when you need it. - Ok to use the inhaler up to 2 puffs  every 4 hours if you must but call for appointment if use goes up over your usual need - Don't leave home without it !!  (think of it like the spare tire for your car)   Plan C = Crisis (instead of Plan B but only if Plan B stops working) - only use your albuterol nebulizer if you first try Plan B and it fails to help > ok to use the nebulizer up to every 4 hours but if start needing it regularly call for immediate appointment   Ok to try albuterol 15 min before an activity (on alternating days)  that you know would usually make you short of breath and see if it makes any difference and if makes none then don't take albuterol after activity unless you can't catch your breath as this means it's the resting that helps, not the albuterol.      Start 02 at 2lpm  24/7   Make sure you check your oxygen saturation  at your highest level of activity  to be sure it stays over 90% and adjust  02 flow upward to maintain this level if needed but remember to turn it back to previous  settings when you stop (to conserve your supply).   Please schedule a follow up visit in 3 months but call sooner if needed

## 2021-04-24 ENCOUNTER — Encounter: Payer: Self-pay | Admitting: Internal Medicine

## 2021-04-24 ENCOUNTER — Other Ambulatory Visit: Payer: Self-pay | Admitting: *Deleted

## 2021-04-24 DIAGNOSIS — J9611 Chronic respiratory failure with hypoxia: Secondary | ICD-10-CM | POA: Insufficient documentation

## 2021-04-24 MED ORDER — REVEFENACIN 175 MCG/3ML IN SOLN: 175.0000 ug | Freq: Every day | RESPIRATORY_TRACT | 11 refills | Status: DC

## 2021-04-24 MED ORDER — FORMOTEROL FUMARATE 20 MCG/2ML IN NEBU: 20.0000 ug | INHALATION_SOLUTION | Freq: Two times a day (BID) | RESPIRATORY_TRACT | 11 refills | Status: DC

## 2021-04-24 NOTE — Assessment & Plan Note (Signed)
Body mass index is 47.85 kg/m.  -  trending up  No results found for: TSH    Contributing to doe and risk of GERD >>>   reviewed the need and the process to achieve and maintain neg calorie balance > defer f/u primary care including intermittently monitoring thyroid status            Each maintenance medication was reviewed in detail including emphasizing most importantly the difference between maintenance and prns and under what circumstances the prns are to be triggered using an action plan format where appropriate.  Total time for H and P, chart review, counseling, reviewing smi/02 device(s) and generating customized AVS unique to this office visit / same day charting  > 30 min

## 2021-04-24 NOTE — Assessment & Plan Note (Signed)
04/23/2021  Room Air at Rest = 85%----increased to 96% when placed on o2 2lpm continuous 04/23/21

## 2021-05-02 ENCOUNTER — Telehealth: Payer: Self-pay | Admitting: Internal Medicine

## 2021-05-02 MED ORDER — FORMOTEROL FUMARATE 20 MCG/2ML IN NEBU: 20.0000 ug | INHALATION_SOLUTION | Freq: Two times a day (BID) | RESPIRATORY_TRACT | 11 refills | Status: AC

## 2021-05-02 MED ORDER — REVEFENACIN 175 MCG/3ML IN SOLN: 175.0000 ug | Freq: Every day | RESPIRATORY_TRACT | 11 refills | Status: DC

## 2021-05-02 NOTE — Telephone Encounter (Signed)
Spoke with Kriste Basque who is calling to request OV notes to fill patients Yupelri. Confirmed fax number and faxed OV notes. Advised her to call if she needed anything else. Nothing further needed at this time.

## 2021-05-02 NOTE — Telephone Encounter (Signed)
Received fax from [facility] about trying to fill RX for Yupelri and Perforomist. They are unable to fill these prescriptions due to being out of network with patient's insurance. Will send RX to [facility] to see if it will work with them and also called patient to see if she wants samples, she did not answer. LMTCB

## 2021-05-07 ENCOUNTER — Other Ambulatory Visit: Payer: Self-pay | Admitting: Internal Medicine

## 2021-05-07 DIAGNOSIS — J449 Chronic obstructive pulmonary disease, unspecified: Secondary | ICD-10-CM

## 2021-05-08 ENCOUNTER — Telehealth: Payer: Self-pay | Admitting: Internal Medicine

## 2021-05-08 DIAGNOSIS — J449 Chronic obstructive pulmonary disease, unspecified: Secondary | ICD-10-CM

## 2021-05-08 MED ORDER — ALBUTEROL SULFATE (2.5 MG/3ML) 0.083% IN NEBU: INHALATION_SOLUTION | RESPIRATORY_TRACT | 2 refills | Status: DC

## 2021-05-08 NOTE — Telephone Encounter (Signed)
Call returned to pharmacy, confirmed patient DOB. Order updated to reflect for entire month.   Nothing further needed at this time.

## 2021-06-05 ENCOUNTER — Other Ambulatory Visit: Payer: Self-pay | Admitting: Internal Medicine

## 2021-07-23 ENCOUNTER — Ambulatory Visit: Payer: Medicaid Other | Admitting: Internal Medicine

## 2021-07-23 NOTE — Progress Notes (Deleted)
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 Colvin Caroli. Pt states that she was dxed with COPD in 2009. She states that she gets winded walking short distances such as from room to room at home. She also c/o cough- worse at night and first thing in the am- clear sputum.    uses neb first thing in am then symbicort w/in half an hour which was about 2 h prior to OV   Last pred was nov 2017 ? [facility]  Wakes up congested/ wheezy but no purulent sputum Doe= MMRC3 = can't walk 100 yards even at a slow pace at a flat grade s stopping due to sob  Onset was indolent and pattern is progressive and parallels wt gain also Worse breathing when exp to strong odors/ heat and humidity  rec Plan A = Automatic = Bevespi Take 2 puffs first thing in am and then another 2 puffs about 12 hours later.  Work on inhaler technique:  Plan B = Backup Only use your albuterol (proair) Plan C = Crisis - only use your albuterol nebulizer if you first try Plan B and it fails to help > ok to use the nebulizer up to every 4 hours but if start needing it regularly call for immediate appointment Take Prednisone 10 mg x 4 for three days 3 for three days 2 for three days 1 for three days and stop      03/11/2018  f/u ov/[name] re: copd III/ group B still smoking  Chief Complaint  Patient presents with   Follow-up    Cough and SOB are  unchanged. She is using proair 2-3 x per day and duoneb 3-4 x per wk on average.   Dyspnea:  MMRC3 = can't walk 100 yards even at a slow pace at a flat grade s stopping due to sob   Cough: worse in am's thick and white / not using mucinex or flutter as rec  Sleeping: 45 degrees 2-3 am needs rx  SABA use: down some 02: no rec The key is to stop smoking completely before smoking completely stops you!  Change your neb to albuterol (not containing ipatropium) up to every 4 hours if your proair inhaler doesn't relieve you Work on inhaler technique:   For cough > mucinex dm 1200 mg every 12 hours and use flutter as much as possible  Please schedule a follow up visit in 3 months but call sooner if needed     06/27/2018  f/u ov/Debra Page re: GOLD III / group B symptoms  Chief Complaint  Patient presents with   Follow-up    Breathing is unchanged. She is using her albuterol inhaler 3-4 x per day and neb with albuterol 3-4 x per wk.   Dyspnea:  MMRC3 = can't walk 100 yards even at a  slow pace at a flat grade s stopping due to sob   Cough: not as much of a problem but mucus is  thick / white esp in am >> not using mucinex / flutter as rec  Sleeping: 45 degrees on side  SABA use: as above/ still struggling with hfa  rec Finish your bevespi as we showed you today and if not making progress call before you run out for the same medication in nebulizer (performist and yupelri) The key is to stop smoking completely before smoking completely stops you!  For cough > mucinex   1200 mg every 12 hours and use the flutter valve     10/24/2018  f/u ov/Debra Page re: GOLD III/ still smoking/ maint on bevespi Chief Complaint  Patient presents with   Follow-up    Breathing is unchanged. She is using her albuterol inhaler 3 x per wk and neb with albuterol 2 x per day on average.    Dyspnea:  MMRC3 = can't walk 100 yards even at a slow pace at a flat grade s stopping due to sob   Cough: usually at hs min mucoid Sleeping:  uncomfortable due to back problems  SABA use: as above  rec Plan A = Automatic =  Bevespi  =  Yupelri once  each am and  performist twice daily  Plan B = Backup Only use your albuterol Proair) inhaler  Plan C = Crisis - only use your albuterol nebulizer if you first try Plan B and it fails to help > ok to use the nebulizer up to every 4 hours but if start needing it regularly call for immediate appointment   04/04/2019  f/u ov/[name] re: copd III/ still smoking  Chief Complaint  Patient presents with   Follow-up    Breathing is doing better. She is using her albuterol 3 x per wk on average and albuterol neb 2 x per day.  Dyspnea:   Doe room to room  Cough: none  Sleeping: sitting up due to back  SABA use: as above, usually when over does it  02: none  New bilateral swelling >cards w/u req by PCP  Rec The key is to stop smoking completely before smoking completely stops you! Remember Only use your albuterol as a rescue medication   04/23/2021  f/u ov/[facility] [facility]/Debra Page re:  COPD GOLD III/ maint on spiriva  Chief Complaint  Patient presents with   Follow-up    Increased SOB past 2 months- felt better on yupelri/performist- worse since switched to Spiriva respimat. O2 sat RA at rest 85%--increased to 96%2lpm cont o2.     Dyspnea:  very sedentary/ only goes out to see Doctor Cough: none  Sleeping: lift recliner straight up  SABA use: hfa and neb since stopped yupelri/formoterol 02: none  Covid status: never vax  Rec Plan A = Automatic = Always=    stop spiriva and start stiolto 2 puffs each am (same as yupelri and performist) Work on inhaler technique:   - we will see if [facility] can supply the yupelir /performist but use the stiolto until then  Plan B = Backup (to supplement plan A, not to replace it) Only use your albuterol inhaler as a rescue medication  Plan C = Crisis (instead of Plan B but only if Plan B stops working) - only use your albuterol nebulizer if you first  try Plan B  Ok to try albuterol 15 min before an activity (on alternating days)  that you know  would usually make you short of breath  Start 02 at 2lpm  24/7  Make sure you check your oxygen saturation  at your highest level of activity  to be sure it stays over 90%      07/23/2021  f/u ov/[place] office/Izabell Schalk re: GOLD 3 copd maint on ***  No chief complaint on file.   Dyspnea:  *** Cough: *** Sleeping: *** SABA use: *** 02: *** Covid status: *** Lung cancer screening: ***   No obvious day to day or daytime variability or assoc excess/ purulent sputum or mucus plugs or hemoptysis or cp or chest tightness, subjective wheeze or overt sinus or hb symptoms.   *** without nocturnal  or early am exacerbation  of respiratory  c/o's or need for noct saba. Also denies any obvious fluctuation of symptoms with weather or environmental changes or other aggravating or alleviating factors except as outlined above   No unusual exposure hx or h/o childhood pna/ asthma or knowledge of premature birth.  Current Allergies, Complete Past Medical History, Past Surgical History, Family History, and Social History were reviewed in [employer] record.  ROS  The following are not active complaints unless bolded Hoarseness, sore throat, dysphagia, dental problems, itching, sneezing,  nasal congestion or discharge of excess mucus or purulent secretions, ear ache,   fever, chills, sweats, unintended wt loss or wt gain, classically pleuritic or exertional cp,  orthopnea pnd or arm/hand swelling  or leg swelling, presyncope, palpitations, abdominal pain, anorexia, nausea, vomiting, diarrhea  or change in bowel habits or change in bladder habits, change in stools or change in urine, dysuria, hematuria,  rash, arthralgias, visual complaints, headache, numbness, weakness or ataxia or problems with walking or coordination,  change in mood or  memory.        No outpatient medications have been  marked as taking for the 07/23/21 encounter (Appointment) with Nyoka Cowden, MD.                    Objective:   Physical Exam     07/23/2021    ***  04/23/2021      245  04/04/2019      234 10/24/2018        224  06/27/2018      225  03/11/2018      224  10/18/2017      220  08/27/2017         214 07/19/2017     212  06/02/2017    210   04/20/17 207 lb 6.4 oz (94.1 kg)  01/23/12 187 lb (84.8 kg)    Vital signs reviewed  07/23/2021  - Note at rest 02 sats  ***% on ***   General appearance:    ***       Mod barrel     trace edema both LEs***             Assessment:

## 2021-08-04 ENCOUNTER — Other Ambulatory Visit: Payer: Self-pay | Admitting: Internal Medicine

## 2021-08-06 ENCOUNTER — Other Ambulatory Visit: Payer: Self-pay | Admitting: Internal Medicine

## 2021-08-06 DIAGNOSIS — J449 Chronic obstructive pulmonary disease, unspecified: Secondary | ICD-10-CM

## 2021-08-08 ENCOUNTER — Ambulatory Visit: Payer: Medicaid Other | Admitting: Internal Medicine

## 2021-09-24 ENCOUNTER — Ambulatory Visit: Payer: Medicaid Other | Admitting: Internal Medicine

## 2021-11-05 ENCOUNTER — Ambulatory Visit: Payer: Medicaid Other | Admitting: Internal Medicine

## 2021-11-06 ENCOUNTER — Other Ambulatory Visit: Payer: Self-pay | Admitting: Internal Medicine

## 2021-11-06 DIAGNOSIS — J449 Chronic obstructive pulmonary disease, unspecified: Secondary | ICD-10-CM

## 2021-12-10 ENCOUNTER — Ambulatory Visit: Payer: Medicaid Other | Admitting: Internal Medicine

## 2021-12-10 NOTE — Progress Notes (Incomplete)
?Subjective:  ?  ? Patient ID: Debra StallCynthia Page, female   DOB: 1966-07-13,     MRN: [id number] ? ?  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 ? ?History of Present Illness  ?04/20/2017 1st [facility] office visit/ Debra Page  Symb/duoneb/proair ?Chief Complaint  ?Patient presents with  ? Pulmonary Consult  ?  Referred by Dr. Colvin CaroliKaren Page. Pt states that she was dxed with COPD in 2009. She states that she gets winded walking short distances such as from room to room at home. She also c/o cough- worse at night and first thing in the am- clear sputum.    ?uses neb first thing in am then symbicort w/in half an hour which was about 2 h prior to OV   ?Last pred was nov 2017 ? Benefit  ?Wakes up congested/ wheezy but no purulent sputum ?Doe= MMRC3 = can't walk 100 yards even at a slow pace at a flat grade s stopping due to sob  ?Onset was indolent and pattern is progressive and parallels wt gain also ?Worse breathing when exp to strong odors/ heat and humidity  ?rec ?Plan A = Automatic = Bevespi Take 2 puffs first thing in am and then another 2 puffs about 12 hours later.  ?Work on inhaler technique:  ?Plan B = Backup ?Only use your albuterol (proair) ?Plan C = Crisis ?- only use your albuterol nebulizer if you first try Plan B and it fails to help > ok to use the nebulizer up to every 4 hours but if start needing it regularly call for immediate appointment ?Take Prednisone 10 mg x 4 for three days 3 for three days 2 for three days 1 for three days and stop  ? ? ? ? ?03/11/2018  f/u ov/Debra Page re: copd III/ group B still smoking  ?Chief Complaint  ?Patient presents with  ? Follow-up  ?  Cough and SOB are  unchanged. She is using proair 2-3 x per day and duoneb 3-4 x per wk on average.   ?Dyspnea:  MMRC3 = can't walk 100 yards even at a slow pace at a flat grade s stopping due to sob   ?Cough: worse in am's thick and white / not using mucinex or flutter as rec  ?Sleeping: 45 degrees 2-3 am needs rx  ?SABA use: down some ?02: no ?rec ?The key is to stop smoking completely before smoking completely stops you!  ?Change your neb to albuterol (not containing ipatropium) up to every 4 hours if your proair inhaler doesn't relieve you ?Work on inhaler technique:   ?For cough > mucinex dm 1200 mg every 12 hours and use flutter as much as possible  ?Please schedule a follow up visit in 3 months but call sooner if needed  ? ? ? ?06/27/2018  f/u ov/Debra Page re: GOLD III / group B symptoms  ?Chief Complaint  ?Patient presents with  ? Follow-up  ?  Breathing is unchanged. She is using her albuterol inhaler 3-4 x per day and neb with albuterol 3-4 x per wk.   ?Dyspnea:  MMRC3 = can't walk 100 yards even at a  slow pace at a flat grade s stopping due to sob   ?Cough: not as much of a problem but mucus is  thick / white esp in am >> not using mucinex / flutter as rec  ?Sleeping: 45 degrees on side  ?SABA use: as above/ still struggling with hfa  ?rec ?Finish your bevespi as we showed you today and if not making progress call before you run out for the same medication in nebulizer (performist and yupelri) ?The key is to stop smoking completely before smoking completely stops you!  ?For cough > mucinex   1200 mg every 12 hours and use the flutter valve  ? ? ? ?10/24/2018  f/u ov/Debra Page re: GOLD III/ still smoking/ maint on bevespi ?Chief Complaint  ?Patient presents with  ? Follow-up  ?  Breathing is unchanged. She is using her albuterol inhaler 3 x per wk and neb with albuterol 2 x per day on average.   ? Dyspnea:  MMRC3 = can't walk 100 yards even at a slow pace at a flat grade s stopping due to sob   ?Cough: usually at hs min mucoid ?Sleeping:  uncomfortable due to back problems  ?SABA use: as above  ?rec ?Plan A = Automatic =  Bevespi  =  Yupelri once  each am and  performist twice daily  ?Plan B = Backup ?Only use your albuterol Proair) inhaler  ?Plan C = Crisis ?- only use your albuterol nebulizer if you first try Plan B and it fails to help > ok to use the nebulizer up to every 4 hours but if start needing it regularly call for immediate appointment ? ? ?04/04/2019  f/u ov/Debra Page re: copd III/ still smoking  ?Chief Complaint  ?Patient presents with  ? Follow-up  ?  Breathing is doing better. She is using her albuterol 3 x per wk on average and albuterol neb 2 x per day.  ?Dyspnea:   Doe room to room  ?Cough: none  ?Sleeping: sitting up due to back  ?SABA use: as above, usually when over does it  ?02: none  ?New bilateral swelling >cards w/u req by PCP  ?Rec ?The key is to stop smoking completely before smoking completely stops you! ?Remember Only use your albuterol as a rescue medication ? ? ?04/23/2021  f/u ov/[facility] office/Debra Page re:  COPD GOLD III/ maint on spiriva  ?Chief Complaint  ?Patient presents with  ? Follow-up  ?  Increased SOB past 2 months- felt better on yupelri/performist- worse since switched to Spiriva respimat. O2 sat RA at rest 85%--increased to 96%2lpm cont o2.   ?  ?Dyspnea:  very sedentary/ only goes out to see Doctor ?Cough: none  ?Sleeping: lift recliner straight up  ?SABA use: hfa and neb since stopped yupelri/formoterol ?02: none  ?Covid status: never vax  ?Rec ?Plan A = Automatic = Always=    stop spiriva and start stiolto 2 puffs each am (same as yupelri and performist) ?Work on inhaler technique:  ?we will see if [facility] can supply the yupelir /performist but use the stiolto until then  ?Plan B = Backup (to supplement plan A, not to replace it) ?Only use your albuterol inhaler as a rescue medication ?Plan C = Crisis (instead of Plan B but only if Plan B stops working) ?- only use your albuterol nebulizer if you first try  Plan B and it fails to help  ?Ok to try albuterol 15 min before an activity (on alternating days)  that  you know would usually make you short of breath ? ?Start 02 at 2lpm  24/7  ? ?Make sure you check your oxygen saturation  at your highest level of activity  to be sure it stays over 90%  ? ? ? ?12/10/2021  f/u ov/[place] office/Debra Page re: copd gold 3/ 02 dep  maint on ***  ?No chief complaint on file. ?  ?Dyspnea:  *** ?Cough: *** ?Sleeping: *** ?SABA use: *** ?02: *** ?Covid status: *** ?Lung cancer screening: *** ? ? ?No obvious day to day or daytime variability or assoc excess/ purulent sputum or mucus plugs or hemoptysis or cp or chest tightness, subjective wheeze or overt sinus or hb symptoms.  ? ?*** without nocturnal  or early am exacerbation  of respiratory  c/o's or need for noct saba. Also denies any obvious fluctuation of symptoms with weather or environmental changes or other aggravating or alleviating factors except as outlined above  ? ?No unusual exposure hx or h/o childhood pna/ asthma or knowledge of premature birth. ? ?Current Allergies, Complete Past Medical History, Past Surgical History, Family History, and Social History were reviewed in [employer] record. ? ?ROS  The following are not active complaints unless bolded ?Hoarseness, sore throat, dysphagia, dental problems, itching, sneezing,  nasal congestion or discharge of excess mucus or purulent secretions, ear ache,   fever, chills, sweats, unintended wt loss or wt gain, classically pleuritic or exertional cp,  orthopnea pnd or arm/hand swelling  or leg swelling, presyncope, palpitations, abdominal pain, anorexia, nausea, vomiting, diarrhea  or change in bowel habits or change in bladder habits, change in stools or change in urine, dysuria, hematuria,  rash, arthralgias, visual complaints, headache, numbness, weakness or ataxia or problems with walking or coordination,  change in mood or  memory. ?      ? ?No  outpatient medications have been marked as taking for the 12/10/21 encounter (Appointment) with Nyoka Cowden, MD.  ?     ?  ? ?  ? ?  ?  ? ?  ?   ?Objective:  ? Physical Exam ? ?wts ? ?12/10/2021      ***  ?8/31/

## 2022-01-06 ENCOUNTER — Other Ambulatory Visit: Payer: Self-pay | Admitting: Internal Medicine

## 2022-01-07 ENCOUNTER — Other Ambulatory Visit: Payer: Self-pay | Admitting: Internal Medicine

## 2022-02-04 ENCOUNTER — Other Ambulatory Visit: Payer: Self-pay | Admitting: Internal Medicine

## 2022-02-25 ENCOUNTER — Ambulatory Visit: Payer: Medicaid Other | Admitting: Internal Medicine

## 2022-02-25 NOTE — Progress Notes (Deleted)
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 Colvin Caroli. Pt states that she was dxed with COPD in 2009. She states that she gets winded walking short distances such as from room to room at home. She also c/o cough- worse at night and first thing in the am- clear sputum.    uses neb first thing in am then symbicort w/in half an hour which was about 2 h prior to OV   Last pred was nov 2017 ? Benefit  Wakes up congested/ wheezy but no purulent sputum Doe= MMRC3 = can't walk 100 yards even at a slow pace at a flat grade s stopping due to sob  Onset was indolent and pattern is progressive and parallels wt gain also Worse breathing when exp to strong odors/ heat and humidity  rec Plan A = Automatic = Bevespi Take 2 puffs first thing in am and then another 2 puffs about 12 hours later.  Work on inhaler technique:  Plan B = Backup Only use your albuterol (proair) Plan C = Crisis - only use your albuterol nebulizer if you first try Plan B and it fails to help > ok to use the nebulizer up to every 4 hours but if start needing it regularly call for immediate appointment Take Prednisone 10 mg x 4 for three days 3 for three days 2 for three days 1 for three days and stop      03/11/2018  f/u ov/Debra Page re: copd III/ group B still smoking  Chief Complaint  Patient presents with   Follow-up    Cough and SOB are  unchanged. She is using proair 2-3 x per day and duoneb 3-4 x per wk on average.   Dyspnea:  MMRC3 = can't walk 100 yards even at a slow pace at a flat grade s stopping due to sob   Cough: worse in am's thick and white / not using mucinex or flutter as rec  Sleeping: 45 degrees 2-3 am needs rx  SABA use: down some 02: no rec The key is to stop smoking completely before smoking completely stops you!  Change your neb to albuterol (not containing ipatropium) up to every 4 hours if your proair inhaler doesn't relieve you Work on inhaler technique:   For cough > mucinex dm 1200 mg every 12 hours and use flutter as much as possible  Please schedule a follow up visit in 3 months but call sooner if needed     06/27/2018  f/u ov/Debra Page re: GOLD III / group B symptoms  Chief Complaint  Patient presents with   Follow-up    Breathing is unchanged. She is using her albuterol inhaler 3-4 x per day and neb with albuterol 3-4 x per wk.   Dyspnea:  MMRC3 = can't walk 100 yards even at a  slow pace at a flat grade s stopping due to sob   Cough: not as much of a problem but mucus is  thick / white esp in am >> not using mucinex / flutter as rec  Sleeping: 45 degrees on side  SABA use: as above/ still struggling with hfa  rec Finish your bevespi as we showed you today and if not making progress call before you run out for the same medication in nebulizer (performist and yupelri) The key is to stop smoking completely before smoking completely stops you!  For cough > mucinex   1200 mg every 12 hours and use the flutter valve     10/24/2018  f/u ov/Debra Page re: GOLD III/ still smoking/ maint on bevespi Chief Complaint  Patient presents with   Follow-up    Breathing is unchanged. She is using her albuterol inhaler 3 x per wk and neb with albuterol 2 x per day on average.    Dyspnea:  MMRC3 = can't walk 100 yards even at a slow pace at a flat grade s stopping due to sob   Cough: usually at hs min mucoid Sleeping:  uncomfortable due to back problems  SABA use: as above  rec Plan A = Automatic =  Bevespi  =  Yupelri once  each am and  performist twice daily  Plan B = Backup Only use your albuterol Proair) inhaler  Plan C = Crisis - only use your albuterol nebulizer if you first try Plan B and it fails to help > ok to use the nebulizer up to every 4 hours but if start needing it regularly call for immediate appointment   04/04/2019  f/u ov/Debra Page re: copd III/ still smoking  Chief Complaint  Patient presents with   Follow-up    Breathing is doing better. She is using her albuterol 3 x per wk on average and albuterol neb 2 x per day.  Dyspnea:   Doe room to room  Cough: none  Sleeping: sitting up due to back  SABA use: as above, usually when over does it  02: none  New bilateral swelling >cards w/u req by PCP  Rec The key is to stop smoking completely before smoking completely stops you! Remember Only use your albuterol as a rescue medication   04/23/2021  f/u ov/Debra Page re:  COPD GOLD III/ maint on spiriva  Chief Complaint  Patient presents with   Follow-up    Increased SOB past 2 months- felt better on yupelri/performist- worse since switched to Spiriva respimat. O2 sat RA at rest 85%--increased to 96%2lpm cont o2.     Dyspnea:  very sedentary/ only goes out to see Doctor Cough: none  Sleeping: lift recliner straight up  SABA use: hfa and neb since stopped yupelri/formoterol 02: none  Covid status: never vax  Rec Plan A = Automatic = Always=    stop spiriva and start stiolto 2 puffs each am (same as yupelri and performist) Work on inhaler technique:   we will see if [facility] can supply the yupelir /performist but use the stiolto until then  Plan B = Backup (to supplement plan A, not to replace it) Only use your albuterol inhaler as a rescue medication  Plan C = Crisis (instead of Plan B but only if Plan B stops working) - only use your albuterol nebulizer if you first try  Plan B and it fails to help   Ok to try albuterol 15 min before an activity (on alternating  days)  that you know would usually make you short of breath    Start 02 at 2lpm  24/7  Make sure you check your oxygen saturation  at your highest level of activity  to be sure it stays over 90%   02/25/2022  f/u ov/[place] office/[facility]james [facility] re: GOLD 3 maint on ***  No chief complaint on file.   Dyspnea:  *** Cough: *** Sleeping: *** SABA use: *** 02: *** Covid status: *** Lung cancer screening: ***   No obvious day to day or daytime variability or assoc excess/ purulent sputum or mucus plugs or hemoptysis or cp or chest tightness, subjective wheeze or overt sinus or hb symptoms.   *** without nocturnal  or early am exacerbation  of respiratory  c/o's or need for noct saba. Also denies any obvious fluctuation of symptoms with weather or environmental changes or other aggravating or alleviating factors except as outlined above   No unusual exposure hx or h/o childhood pna/ asthma or knowledge of premature birth.  Current Allergies, Complete Past Medical History, Past Surgical History, Family History, and Social History were reviewed in [employer] record.  ROS  The following are not active complaints unless bolded Hoarseness, sore throat, dysphagia, dental problems, itching, sneezing,  nasal congestion or discharge of excess mucus or purulent secretions, ear ache,   fever, chills, sweats, unintended wt loss or wt gain, classically pleuritic or exertional cp,  orthopnea pnd or arm/hand swelling  or leg swelling, presyncope, palpitations, abdominal pain, anorexia, nausea, vomiting, diarrhea  or change in bowel habits or change in bladder habits, change in stools or change in urine, dysuria, hematuria,  rash, arthralgias, visual complaints, headache, numbness, weakness or ataxia or problems with walking or coordination,  change in mood or  memory.        No outpatient medications  have been marked as taking for the 02/25/22 encounter (Appointment) with Nyoka Cowden, MD.                        Objective:   Physical Exam  wts    02/25/2022        ***  04/23/2021      245  04/04/2019      234 10/24/2018        224  06/27/2018      225  03/11/2018      224  10/18/2017      220  08/27/2017         214 07/19/2017     212  06/02/2017    210   04/20/17 207 lb 6.4 oz (94.1 kg)  01/23/12 187 lb (84.8 kg)    Vital signs reviewed  02/25/2022  - Note at rest 02 sats  ***% on ***   General appearance:    ***     Mod bar ***       Assessment:

## 2022-03-06 ENCOUNTER — Other Ambulatory Visit: Payer: Self-pay | Admitting: Internal Medicine

## 2022-04-08 ENCOUNTER — Ambulatory Visit: Payer: Medicaid Other | Admitting: Internal Medicine

## 2022-04-08 NOTE — Progress Notes (Deleted)
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 Colvin Page. Pt states that she was dxed with COPD in 2009. She states that she gets winded walking short distances such as from room to room at home. She also c/o cough- worse at night and first thing in the am- clear sputum.    uses neb first thing in am then symbicort w/in half an hour which was about 2 h prior to OV   Last pred was nov 2017 ? Benef[facility]  Wakes up congested/ wheezy but no purulent sputum Doe= MMRC3 = can't walk 100 yards even at a slow pace at a flat grade s stopping due to sob  Onset was indolent and pattern is progressive and parallels wt gain also Worse breathing when exp to strong odors/ heat and humidity  rec Plan A = Automatic = Bevespi Take 2 puffs first thing in am and then another 2 puffs about 12 hours later.  Work on inhaler technique:  Plan B = Backup Only use your albuterol (proair) Plan C = Crisis - only use your albuterol nebulizer if you first try Plan B and it fails to help > ok to use the nebulizer up to every 4 hours but if start needing it regularly call for immediate appointment Take Prednisone 10 mg x 4 for three days 3 for three days 2 for three days 1 for three days and stop      03/11/2018  f/u ov/Debra Page re: copd III/ group B still smoking  Chief Complaint  Patient presents with   Follow-up    Cough and SOB are  unchanged. She is using proair 2-3 x per day and duoneb 3-4 x per wk on average.   Dyspnea:  MMRC3 = can't walk 100 yards even at a slow pace at a flat grade s stopping due to sob   Cough: worse in am's thick and white / not using mucinex or flutter as rec  Sleeping: 45 degrees 2-3 am needs rx  SABA use: down some 02: no rec The key is to stop smoking completely before smoking completely stops you!  Change your neb to albuterol (not containing ipatropium) up to every 4 hours if your proair inhaler doesn't relieve you Work on inhaler technique:   For cough > mucinex dm 1200 mg every 12 hours and use flutter as much as possible  Please schedule a follow up visit in 3 months but call sooner if needed     06/27/2018  f/u ov/Debra Page re: GOLD III / group B symptoms  Chief Complaint  Patient presents with   Follow-up    Breathing is unchanged. She is using her albuterol inhaler 3-4 x per day and neb with albuterol 3-4 x per wk.   Dyspnea:  MMRC3 = can't walk 100 yards even at a  slow pace at a flat grade s stopping due to sob   Cough: not as much of a problem but mucus is  thick / white esp in am >> not using mucinex / flutter as rec  Sleeping: 45 degrees on side  SABA use: as above/ still struggling with hfa  rec Finish your bevespi as we showed you today and if not making progress call before you run out for the same medication in nebulizer (performist and yupelri) The key is to stop smoking completely before smoking completely stops you!  For cough > mucinex   1200 mg every 12 hours and use the flutter valve     10/24/2018  f/u ov/Debra Page re: GOLD III/ still smoking/ maint on bevespi Chief Complaint  Patient presents with   Follow-up    Breathing is unchanged. She is using her albuterol inhaler 3 x per wk and neb with albuterol 2 x per day on average.    Dyspnea:  MMRC3 = can't walk 100 yards even at a slow pace at a flat grade s stopping due to sob   Cough: usually at hs min mucoid Sleeping:  uncomfortable due to back problems  SABA use: as above  rec Plan A = Automatic =  Bevespi  =  Yupelri once  each am and  performist twice daily  Plan B = Backup Only use your albuterol Proair) inhaler  Plan C = Crisis - only use your albuterol nebulizer if you first try Plan B and it fails to help > ok to use the nebulizer up to every 4 hours but if start needing it regularly call for immediate appointment   04/04/2019  f/u ov/Debra Page re: copd III/ still smoking  Chief Complaint  Patient presents with   Follow-up    Breathing is doing better. She is using her albuterol 3 x per wk on average and albuterol neb 2 x per day.  Dyspnea:   Doe room to room  Cough: none  Sleeping: sitting up due to back  SABA use: as above, usually when over does it  02: none  New bilateral swelling >cards w/u req by PCP  Rec The key is to stop smoking completely before smoking completely stops you! Remember Only use your albuterol as a rescue medication   04/23/2021  f/u ov/Debra Page/Debra Page re:  COPD GOLD III/ maint on spiriva  Chief Complaint  Patient presents with   Follow-up    Increased SOB past 2 months- felt better on yupelri/performist- worse since switched to Spiriva respimat. O2 sat RA at rest 85%--increased to 96%2lpm cont o2.     Dyspnea:  very sedentary/ only goes out to see Doctor Cough: none  Sleeping: lift recliner straight up  SABA use: hfa and neb since stopped yupelri/formoterol 02: none  Covid status: never vax  Rec Plan A = Automatic = Always=    stop spiriva and start stiolto 2 puffs each am (same as yupelri and performist) Work on inhaler technique: - we will see if [facility] can supply the yupelir /performist but use the stiolto until then  Plan B = Backup (to supplement plan A, not to replace it) Only use your albuterol inhaler as a rescue medication Plan C = Crisis (instead of Plan B but only if Plan B stops working) - only use your albuterol nebulizer if you first try  Plan B and it fails to help Ok to try albuterol 15 min before an activity (on alternating days)  that you  know would usually make you short of breath    Start 02 at 2lpm  24/7  Make sure you check your oxygen saturation  at your highest level of activity  to be sure it stays over 90%     04/08/2022  f/u ov/Debra Page/Debra Page re: GOLD 3 copd  maint on ***  No chief complaint on file.   Dyspnea:  *** Cough: *** Sleeping: *** SABA use: *** 02: *** Covid status: *** Lung cancer screening: ***   No obvious day to day or daytime variability or assoc excess/ purulent sputum or mucus plugs or hemoptysis or cp or chest tightness, subjective wheeze or overt sinus or hb symptoms.   *** without nocturnal  or early am exacerbation  of respiratory  c/o's or need for noct saba. Also denies any obvious fluctuation of symptoms with weather or environmental changes or other aggravating or alleviating factors except as outlined above   No unusual exposure hx or h/o childhood pna/ asthma or knowledge of premature birth.  Current Allergies, Complete Past Medical History, Past Surgical History, Family History, and Social History were reviewed in [employer] record.  ROS  The following are not active complaints unless bolded Hoarseness, sore throat, dysphagia, dental problems, itching, sneezing,  nasal congestion or discharge of excess mucus or purulent secretions, ear ache,   fever, chills, sweats, unintended wt loss or wt gain, classically pleuritic or exertional cp,  orthopnea pnd or arm/hand swelling  or leg swelling, presyncope, palpitations, abdominal pain, anorexia, nausea, vomiting, diarrhea  or change in bowel habits or change in bladder habits, change in stools or change in urine, dysuria, hematuria,  rash, arthralgias, visual complaints, headache, numbness, weakness or ataxia or problems with walking or coordination,  change in mood or  memory.        No outpatient  medications have been marked as taking for the 04/08/22 encounter (Appointment) with Nyoka Cowden, MD.                     Objective:   Physical Exam  Wts  04/08/2022         *** 04/23/2021      245  04/04/2019      234 10/24/2018        224  06/27/2018      225  03/11/2018      224  10/18/2017      220  08/27/2017         214 07/19/2017     212  06/02/2017    210   04/20/17 207 lb 6.4 oz (94.1 kg)  01/23/12 187 lb (84.8 kg)     Vital signs reviewed  04/08/2022  - Note at rest 02 sats  ***% on ***   General appearance:    ***    Mod bar  trace edema both LEs***              Assessment:

## 2022-04-09 ENCOUNTER — Other Ambulatory Visit: Payer: Self-pay | Admitting: Internal Medicine

## 2022-04-09 DIAGNOSIS — J449 Chronic obstructive pulmonary disease, unspecified: Secondary | ICD-10-CM

## 2022-04-25 ENCOUNTER — Other Ambulatory Visit: Payer: Self-pay | Admitting: Internal Medicine

## 2022-05-20 ENCOUNTER — Encounter: Payer: Self-pay | Admitting: Internal Medicine

## 2022-05-20 ENCOUNTER — Telehealth: Payer: Self-pay | Admitting: Internal Medicine

## 2022-05-20 ENCOUNTER — Ambulatory Visit (INDEPENDENT_AMBULATORY_CARE_PROVIDER_SITE_OTHER): Payer: Medicaid Other | Admitting: Internal Medicine

## 2022-05-20 DIAGNOSIS — R058 Other specified cough: Secondary | ICD-10-CM | POA: Diagnosis not present

## 2022-05-20 DIAGNOSIS — J449 Chronic obstructive pulmonary disease, unspecified: Secondary | ICD-10-CM | POA: Diagnosis not present

## 2022-05-20 DIAGNOSIS — J9611 Chronic respiratory failure with hypoxia: Secondary | ICD-10-CM

## 2022-05-20 NOTE — Assessment & Plan Note (Addendum)
Quit smoking 03/2021  / [facility] Spirometry 04/20/2017  FEV1 0.94 (39%)  Ratio 56 p am symb/duoneb w/in 2 h - 04/20/2017   Walked RA x one lap @ 185 stopped due to  Sob/ fast pace, no desat - 04/20/2017  After extensive coaching HFA effectiveness =    50%  > try bevespi 2bid  No better  - PFT's  06/02/2017  FEV1 0.79  (33 % ) ratio 45  p no % improvement from saba p symbicort x 2 pffs  prior to study with DLCO  66/66c % corrects to 86  % for alv volume   - added flutter valve 06/02/2017\ - [facility] Screening 06/02/17  MM  Level 158 - 06/02/2017    try spiriva smi 2 qam and gerd rx > no better / thrush on symbiocrt 160 @ ov 07/19/2017  - 07/19/2017    changed back to bevespi 2 bid  - 03/11/2018   Walked RA  2 laps @ 185 ft each stopped due to  Sob with sats ok/ relatively slow pace  - 10/24/2018  After extensive coaching inhaler device,  effectiveness =    50% at most due to very short Ti so rec change  to [place] and Perforomist in nebulizer form> improved until insurance stopped paying for it - 04/23/2021  After extensive coaching inhaler device,  effectiveness =    75% (short ti) try stiolto 2 pffs each am and prn saba   Pt is Group B in terms of symptom/risk and laba/lama therefore appropriate rx at this point >>>  stiolto or bevespi or anoro best options - yupelri and perforomist nebs not covered so the best she can do is what's listed on medicaid formulary > will have pharmacy to review

## 2022-05-20 NOTE — Patient Instructions (Addendum)
will see if our pharmacist can get list ov covered medications for copd   You should apply for Medicare as they will cover your nebulizer meds if Medicaid is your secondary    Plan B = Backup (to supplement plan A, not to replace it) Only use your albuterol inhaler as a rescue medication to be used if you can't catch your breath by resting or doing a relaxed purse lip breathing pattern.  - The less you use it, the better it will work when you need it. - Ok to use the inhaler up to 2 puffs  every 4 hours if you must but call for appointment if use goes up over your usual need - Don't leave home without it !!  (think of it like the spare tire for your car)   Plan C = Crisis (instead of Plan B but only if Plan B stops working) - only use your albuterol nebulizer if you first try Plan B and it fails to help > ok to use the nebulizer up to every 4 hours but if start needing it regularly call for immediate appointment   Ok to try albuterol 15 min before an activity (on alternating days)  that you know would usually make you short of breath and see if it makes any difference and if makes none then don't take albuterol after activity unless you can't catch your breath as this means it's the resting that helps, not the albuterol.      Start 02 at 2lpm  24/7   Make sure you check your oxygen saturation  at your highest level of activity  to be sure it stays over 90% and adjust  02 flow upward to maintain this level if needed but remember to turn it back to previous settings when you stop (to conserve your supply).   Please schedule a follow up office visit in 4 weeks, sooner if needed  with all medications /inhalers/ solutions in hand so we can verify exactly what you are taking. This includes all medications from all doctors and over the counters

## 2022-05-20 NOTE — Progress Notes (Signed)
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 Colvin Caroli. Pt states that she was dxed with COPD in 2009. She states that she gets winded walking short distances such as from room to room at home. She also c/o cough- worse at night and first thing in the am- clear sputum.    uses neb first thing in am then symbicort w/in half an hour which was about 2 h prior to OV   Last pred was nov 2017 ? [facility]  Wakes up congested/ wheezy but no purulent sputum Doe= MMRC3 = can't walk 100 yards even at a slow pace at a flat grade s stopping due to sob  Onset was indolent and pattern is progressive and parallels wt gain also Worse breathing when exp to strong odors/ heat and humidity  rec Plan A = Automatic = Bevespi Take 2 puffs first thing in am and then another 2 puffs about 12 hours later.  Work on inhaler technique:  Plan B = Backup Only use your albuterol (proair) Plan C = Crisis - only use your albuterol nebulizer if you first try Plan B and it fails to help > ok to use the nebulizer up to every 4 hours but if start needing it regularly call for immediate appointment Take Prednisone 10 mg x 4 for three days 3 for three days 2 for three days 1 for three days and stop      03/11/2018  f/u ov/Debra Page re: copd III/ group B still smoking  Chief Complaint  Patient presents with   Follow-up    Cough and SOB are  unchanged. She is using proair 2-3 x per day and duoneb 3-4 x per wk on average.   Dyspnea:  MMRC3 = can't walk 100 yards even at a slow pace at a flat grade s stopping due to sob   Cough: worse in am's thick and white / not using mucinex or flutter as rec  Sleeping: 45 degrees 2-3 am needs rx  SABA use: down some 02: no rec The key is to stop smoking completely before smoking completely stops you!  Change your neb to albuterol (not containing ipatropium) up to every 4 hours if your proair inhaler doesn't relieve you Work on inhaler technique:   For cough > mucinex dm 1200 mg every 12 hours and use flutter as much as possible  Please schedule a follow up visit in 3 months but call sooner if needed     06/27/2018  f/u ov/Debra Page re: GOLD III / group B symptoms  Chief Complaint  Patient presents with   Follow-up    Breathing is unchanged. She is using her albuterol inhaler 3-4 x per day and neb with albuterol 3-4 x per wk.   Dyspnea:  MMRC3 = can't walk 100 yards even at a  slow pace at a flat grade s stopping due to sob   Cough: not as much of a problem but mucus is  thick / white esp in am >> not using mucinex / flutter as rec  Sleeping: 45 degrees on side  SABA use: as above/ still struggling with hfa  rec Finish your bevespi as we showed you today and if not making progress call before you run out for the same medication in nebulizer (performist and yupelri) The key is to stop smoking completely before smoking completely stops you!  For cough > mucinex   1200 mg every 12 hours and use the flutter valve          04/23/2021  f/u ov/[name] office/Debra Page re:  COPD GOLD III/ maint on spiriva  Chief Complaint  Patient presents with   Follow-up    Increased SOB past 2 months- felt better on yupelri/performist- worse since switched to Spiriva respimat. O2 sat RA at rest 85%--increased to 96%2lpm cont o2.    Dyspnea:  very sedentary/ only goes out to see Doctor Cough: none  Sleeping:  lift recliner straight up  SABA use: hfa and neb since stopped yupelri/formoterol 02: none  Covid status: never vax  Rec Plan A = Automatic = Always=    stop spiriva and start stiolto 2 puffs each am (same as yupelri and performist) Work on inhaler technique:   - we will see if [facility] can supply the yupelri/performist but use the stiolto until then  Plan B = Backup (to supplement plan A, not to replace it) Only use your albuterol inhaler as a rescue medication  Plan C = Crisis (instead of Plan B but only if Plan B stops working) - only use your albuterol nebulizer if you first try Plan B  Ok to try albuterol 15 min before an activity (on alternating days)  that you know would usually make you short of breath Start 02 at 2lpm  24/7  check your oxygen saturation  at your highest level of activity  to be sure it stays over 90%   Please schedule a follow up visit in 3 months but call sooner if needed     05/20/2022  f/u ov/ office/Debra Page re: COPD GOLD 3  maint on no  Chief Complaint  Patient presents with   Follow-up    12 month fu.  Patient states that she has to use her O2 24/7 now and that sometimes her oxygen sats drop to the high 60's low 70's. She states when this happens she does not increase her O2, instead she uses a nebulizer treatment. Has been using 2LO2 cont. 24/7      Virtual Visit via Telephone Note 05/20/2022   I connected with Debra Page on 05/20/22 at  1:45 PM EDT by telephone and verified that I am speaking with the correct person using two identifiers. Pt is at home and this call made from my office with no other participants    I discussed the limitations, risks, security and privacy concerns of performing an evaluation and management service by telephone and the availability of in person appointments. I also discussed with the patient that there may be a patient responsible charge related to this service. The patient expressed understanding and agreed  to proceed.   History of Present Illness: Dyspnea:  knees and breathing and wt gain all limiting to one half way across the room  Cough: none  Sleeping: flat bed/ lots of pillows  SABA use:  albuterol twice daily  02: 2lpm    No obvious day to day or daytime variability or assoc excess/ purulent sputum or mucus plugs or hemoptysis or cp or chest tightness, subjective wheeze or overt sinus or hb symptoms.    Also denies any obvious fluctuation of symptoms with weather or environmental changes or other aggravating or alleviating factors except as outlined above.   Meds reviewed/ med reconciliation completed     Current Meds -  - NOTE:   Unable to verify as accurately reflecting what pt takes    Medication Sig   albuterol (PROVENTIL) (2.5 MG/3ML) 0.083% nebulizer solution INHALE CONTENTS OF 1 VIAL IN NEBULIZER EVERY 6 HOURS AS NEEDED FOR WHEEZING OR SHORTNESS OF BREATH   albuterol (VENTOLIN HFA) 108 (90 Base) MCG/ACT inhaler INHALE 1-2 PUFFS EVERY 4 TO 6 HOURS AS NEEDED FOR RESCUE   Cetirizine HCl (ZYRTEC ALLERGY) 10 MG CAPS Take 1 capsule (10 mg total) by mouth daily.   formoterol (PERFOROMIST) 20 MCG/2ML nebulizer solution Take 2 mLs (20 mcg total) by nebulization 2 (two) times daily.   ibuprofen (ADVIL) 200 MG tablet Take 200 mg by mouth every 6 (six) hours as needed.   levothyroxine (SYNTHROID, LEVOTHROID) 100 MCG tablet Take 100 mcg by mouth daily.   pantoprazole (PROTONIX) 40 MG tablet TAKE ONE TABLET BY MOUTH 30-60 MINUTES BEFORE THE FIRST MEAL OF THE DAY.   Respiratory Therapy Supplies (FLUTTER) DEVI 1 Device by Does not apply route as needed.   RESTASIS 0.05 % ophthalmic emulsion Apply 1 drop to eye 2 (two) times daily.   revefenacin (YUPELRI) 175 MCG/3ML nebulizer solution Take 3 mLs (175 mcg total) by nebulization daily.   simvastatin (ZOCOR) 20 MG tablet Take 20 mg by mouth at bedtime.   Tiotropium Bromide-Olodaterol (STIOLTO RESPIMAT) 2.5-2.5 MCG/ACT AERS Inhale 2 puffs into  the lungs daily.         Observations/Objective: Slt hoarse voice texture, no conversational sob    Assessment and Plan:  See problem list    Follow Up Instructions: See avs for instructions unique to this ov which includes revised/ updated med list     I discussed the assessment and treatment plan with the patient. The patient was provided an opportunity to ask questions and all were answered. The patient agreed with the plan and demonstrated an understanding of the instructions.   The patient was advised to call back or seek an in-person evaluation if the symptoms worsen or if the condition fails to improve as anticipated.  I provided > 25  minutes of non-face-to-face time during this encounter.   Sandrea Hughs, MD

## 2022-05-20 NOTE — Assessment & Plan Note (Signed)
04/23/2021  Room Air at Rest = 85%----increased to 96% when placed on o2 2lpm continuous 04/23/21   Reminded Make sure you check your oxygen saturation  AT  your highest level of activity (not after you stop)   to be sure it stays over 90% and adjust  02 flow upward to maintain this level if needed but remember to turn it back to previous settings when you stop (to conserve your supply).   F/u in one month with formulary review in meantime  Advised:  formulary restrictions will be an ongoing challenge for the forseable future and I would be happy to pick an alternative if the pt will first  provide me a list of them -  pt  will need to return here for training for any new device that is required eg dpi vs hfa vs respimat.    In the meantime we can always provide samples so that the patient never runs out of any needed respiratory medications.

## 2022-05-20 NOTE — Telephone Encounter (Signed)
"  Patient would like today's visit to be a telephone visit. States cannot do mychart video visit. Patient phone number is [phone]."  Dr. Melvyn Novas please advise, is this okay?

## 2022-05-20 NOTE — Telephone Encounter (Signed)
Called and spoke to patient and notified her it was ok to do telephone visit per MW.  Went over immunizations,, allergies, medications, visit info with her while on the phone. Nothing further needed

## 2022-06-04 ENCOUNTER — Other Ambulatory Visit: Payer: Self-pay | Admitting: Internal Medicine

## 2022-06-16 ENCOUNTER — Other Ambulatory Visit: Payer: Self-pay | Admitting: Internal Medicine

## 2022-07-15 ENCOUNTER — Ambulatory Visit: Payer: Medicaid Other | Admitting: Internal Medicine

## 2022-08-10 ENCOUNTER — Other Ambulatory Visit: Payer: Self-pay | Admitting: Internal Medicine

## 2022-08-10 DIAGNOSIS — J449 Chronic obstructive pulmonary disease, unspecified: Secondary | ICD-10-CM

## 2022-08-26 ENCOUNTER — Ambulatory Visit: Payer: Medicaid Other | Admitting: Internal Medicine

## 2022-08-28 ENCOUNTER — Other Ambulatory Visit: Payer: Self-pay | Admitting: Internal Medicine

## 2022-08-28 DIAGNOSIS — J449 Chronic obstructive pulmonary disease, unspecified: Secondary | ICD-10-CM

## 2022-09-03 ENCOUNTER — Telehealth: Payer: Self-pay | Admitting: Internal Medicine

## 2022-09-03 NOTE — Telephone Encounter (Signed)
Patient has passed away - circumstances are unknown, but I believe she may have been found in her home by the [facility] sheriff's dept.  The death certificate appeared in the [facility] work queue in the [place] system on 1/10 - Case ID# [id number].  Because she had last been seen by Dr. Melvyn Novas in Sept 2023 via tele-visit and the last notes available are from the [facility] dept in Nov 2023, I relinquished the form back to the funeral home - [employer].   It was back in our [facility] work queue today with the note from the funeral home "In speaking with [facility] Dept[facility] they stated that the doctors at the health department cannot sign death certificates, and that the last doctor who saw her, even though it was a tele-visit, is obligated to sign the death certificate.'   Per Dr. Gustavus Bryant instructions, I have reached out to our contact at the [place] department to determine what to do next.  We believe this needs to be turned over to the medical examiner.   Dr. Melvyn Novas is not going to sign the death certificate.

## 2022-09-04 NOTE — Telephone Encounter (Signed)
I looked back at the physical therapy records from [facility] and found the phone number for the patient's PCP - it is the [facility].  I left a voice message for the triage nurse to call me and let me know who to tell the funeral home to send the death certificate to.

## 2022-09-04 NOTE — Telephone Encounter (Signed)
The following note was sent to Dr. Christinia Gully via EPIC chat:   I contacted [facility] and [facility] clinic (where patient was being seen). I received a call a few minutes ago from the [facility], Konrad Dolores [phone]. He said the folks at the [facility] do not have [facility] access so cannot do the death certificate. Since you are the last physician on her records (tele-visit in Sept 2023) and you renewed her medication prescription in Nov 2023 and Jan 2024, by law you are the physician that needs to complete the death certificate. He said he would be glad to talk to you and answer any questions. She was found dead in her home by the sheriff's dept. and his belief is due to respiratory failure with underlying causes such as obesity, smoking, etc. This death certificate is still in our [place] work queue. ID# [id number]. I've tried to put this off on another physician, but I haven't been successful.

## 2022-09-08 ENCOUNTER — Other Ambulatory Visit: Payer: Self-pay | Admitting: Internal Medicine

## 2022-09-24 DEATH — deceased
